# Patient Record
Sex: Female | Born: 1935
Health system: Southern US, Community
[De-identification: ages and names within clinical notes are randomized; demographics above are authoritative.]

## PROBLEM LIST (undated history)

## (undated) DIAGNOSIS — G4700 Insomnia, unspecified: Secondary | ICD-10-CM

## (undated) DIAGNOSIS — M810 Age-related osteoporosis without current pathological fracture: Secondary | ICD-10-CM

## (undated) DIAGNOSIS — E876 Hypokalemia: Secondary | ICD-10-CM

## (undated) DIAGNOSIS — K219 Gastro-esophageal reflux disease without esophagitis: Secondary | ICD-10-CM

## (undated) DIAGNOSIS — Z7409 Other reduced mobility: Secondary | ICD-10-CM

## (undated) DIAGNOSIS — I10 Essential (primary) hypertension: Secondary | ICD-10-CM

## (undated) DIAGNOSIS — F329 Major depressive disorder, single episode, unspecified: Secondary | ICD-10-CM

## (undated) DIAGNOSIS — F1120 Opioid dependence, uncomplicated: Secondary | ICD-10-CM

## (undated) DIAGNOSIS — E039 Hypothyroidism, unspecified: Secondary | ICD-10-CM

## (undated) DIAGNOSIS — N183 Chronic kidney disease, stage 3 unspecified: Secondary | ICD-10-CM

## (undated) DIAGNOSIS — F0390 Unspecified dementia without behavioral disturbance: Secondary | ICD-10-CM

---

## 2018-05-18 DIAGNOSIS — R627 Adult failure to thrive: Secondary | ICD-10-CM | POA: Diagnosis not present

## 2018-05-18 DIAGNOSIS — M25462 Effusion, left knee: Secondary | ICD-10-CM | POA: Diagnosis not present

## 2018-05-18 DIAGNOSIS — G894 Chronic pain syndrome: Secondary | ICD-10-CM | POA: Diagnosis not present

## 2018-05-18 DIAGNOSIS — I10 Essential (primary) hypertension: Secondary | ICD-10-CM | POA: Diagnosis not present

## 2018-05-24 ENCOUNTER — Ambulatory Visit (INDEPENDENT_AMBULATORY_CARE_PROVIDER_SITE_OTHER): Payer: Medicare HMO

## 2018-05-24 ENCOUNTER — Encounter (INDEPENDENT_AMBULATORY_CARE_PROVIDER_SITE_OTHER): Payer: Self-pay | Admitting: Orthopaedic Surgery

## 2018-05-24 ENCOUNTER — Ambulatory Visit (INDEPENDENT_AMBULATORY_CARE_PROVIDER_SITE_OTHER): Payer: Medicare HMO | Admitting: Orthopaedic Surgery

## 2018-05-24 VITALS — BP 167/81 | HR 70 | Ht 61.0 in | Wt 87.0 lb

## 2018-05-24 DIAGNOSIS — M1711 Unilateral primary osteoarthritis, right knee: Secondary | ICD-10-CM

## 2018-05-24 MED ORDER — LIDOCAINE HCL 1 % IJ SOLN
2.0000 mL | INTRAMUSCULAR | Status: AC | PRN
  Administered 2018-05-24: 2 mL

## 2018-05-24 MED ORDER — BUPIVACAINE HCL 0.5 % IJ SOLN
2.0000 mL | INTRAMUSCULAR | Status: AC | PRN
  Administered 2018-05-24: 2 mL via INTRA_ARTICULAR

## 2018-05-24 MED ORDER — METHYLPREDNISOLONE ACETATE 40 MG/ML IJ SUSP
80.0000 mg | INTRAMUSCULAR | Status: AC | PRN
  Administered 2018-05-24: 80 mg

## 2018-05-24 NOTE — Progress Notes (Signed)
Office Visit Note   Patient: Melissa Hill           Date of Birth: Mar 29, 1930           MRN: [id number] Visit Date: 05/24/2018              Requested by: No referring provider defined for this encounter. PCP: No primary care provider on file.   Assessment & Plan: Visit Diagnoses:  1. Unilateral primary osteoarthritis, right knee     Plan: Osteoarthritis right knee with chondrocalcinosis.  Will inject knee with cortisone and monitor response  Follow-Up Instructions: Return if symptoms worsen or fail to improve.   Orders:  Orders Placed This Encounter  Procedures  . Large Joint Inj: R knee  . XR KNEE 3 VIEW RIGHT   No orders of the defined types were placed in this encounter.     Procedures: Large Joint Inj: R knee on 05/24/2018 12:08 PM Indications: pain and diagnostic evaluation Details: 25 G 1.5 in needle, anteromedial approach  Arthrogram: No  Medications: 2 mL lidocaine 1 %; 2 mL bupivacaine 0.5 %; 80 mg methylPREDNISolone acetate 40 MG/ML Procedure, treatment alternatives, risks and benefits explained, specific risks discussed. Consent was given by the patient. Immediately prior to procedure a time out was called to verify the correct patient, procedure, equipment, support staff and site/side marked as required. Patient was prepped and draped in the usual sterile fashion.       Clinical Data: No additional findings.   Subjective: Chief Complaint  Patient presents with  . New Patient (Initial Visit)    R KNEE PAIN AND EFFUSION NO INJURY GETTING WORSE LAST 2 WEEKS  Mrs. Melissa Hill is 84 years old and accompanied by several family members and seen for evaluation of right knee pain.  She experienced insidious onset of knee pain over the past several weeks.  She denies any injury or trauma.  She is tried heat, lidocaine patches, methadone and naproxen.  She feels like her knee is larger than the left knee.  She has had some difficulty with ambulation.  [facility]  HPI  Review of Systems  Constitutional: Positive for fatigue. Negative for fever.  HENT: Negative for ear pain.   Eyes: Negative for pain.  Respiratory: Negative for cough and shortness of breath.   Cardiovascular: Positive for leg swelling.  Gastrointestinal: Positive for constipation. Negative for diarrhea.  Genitourinary: Negative for difficulty urinating.  Musculoskeletal: Positive for back pain. Negative for neck pain.  Skin: Negative for rash.  Allergic/Immunologic: Negative for food allergies.  Neurological: Positive for weakness. Negative for numbness.  Hematological: Bruises/bleeds easily.  Psychiatric/Behavioral: Negative for sleep disturbance.     Objective: Vital Signs: BP (!) 167/81 (BP Location: Left Arm, Patient Position: Sitting, Cuff Size: Normal)   Pulse 70   Ht 5\' 1"  (1.549 m)   Wt 87 lb (39.5 kg)   BMI 16.44 kg/m   Physical Exam  Constitutional: She is oriented to person, place, and time. She appears well-developed and well-nourished.  HENT:  Mouth/Throat: Oropharynx is clear and moist.  Eyes: Pupils are equal, round, and reactive to light. EOM are normal.  Pulmonary/Chest: Effort normal.  Neurological: She is alert and oriented to person, place, and time.  Skin: Skin is warm and dry.  Psychiatric: She has a normal mood and affect. Her behavior is normal.    Ortho Exam awake alert and oriented x3.  Comfortable sitting.  Slightly hard of hearing.  Very pleasant.  Right knee with small effusion.  Having pain along the medial lateral compartments.  Lacks just a few degrees to full extension and flexed over 100 degrees.  No instability.  No popliteal pain.  No calf pain.  +1 pulses.  No distal edema.  Normal sensibility.  Straight leg raise negative.  Painless range of motion both hips  Specialty Comments:  No specialty comments available.  Imaging: Xr Knee 3 View Right  Result Date: 05/24/2018 Films of the right knee repayment  several projections standing.  There is diffuse calcification within the menisci consistent with chondrocalcinosis.  Diffuse bony demineralization.  Diffuse calcification within the arterial system.  Some mild decrease in the joint spaces particularly medially with no acute changes.  Small effusion    PMFS History: There are no active problems to display for this patient.  History reviewed. No pertinent past medical history.  History reviewed. No pertinent family history.  Past Surgical History:  Procedure Laterality Date  . CESAREAN SECTION     Social History   Occupational History  . Not on file  Tobacco Use  . Smoking status: Former Games developer  . Smokeless tobacco: Never Used  Substance and Sexual Activity  . Alcohol use: Not Currently  . Drug use: Not Currently  . Sexual activity: Not on file

## 2018-06-12 DIAGNOSIS — K219 Gastro-esophageal reflux disease without esophagitis: Secondary | ICD-10-CM | POA: Diagnosis not present

## 2018-06-12 DIAGNOSIS — F1120 Opioid dependence, uncomplicated: Secondary | ICD-10-CM | POA: Diagnosis not present

## 2018-06-12 DIAGNOSIS — I10 Essential (primary) hypertension: Secondary | ICD-10-CM | POA: Diagnosis not present

## 2018-06-12 DIAGNOSIS — R627 Adult failure to thrive: Secondary | ICD-10-CM | POA: Diagnosis not present

## 2018-06-27 DIAGNOSIS — M2042 Other hammer toe(s) (acquired), left foot: Secondary | ICD-10-CM | POA: Diagnosis not present

## 2018-06-27 DIAGNOSIS — B351 Tinea unguium: Secondary | ICD-10-CM | POA: Diagnosis not present

## 2018-07-13 DIAGNOSIS — E039 Hypothyroidism, unspecified: Secondary | ICD-10-CM | POA: Diagnosis not present

## 2018-07-13 DIAGNOSIS — G894 Chronic pain syndrome: Secondary | ICD-10-CM | POA: Diagnosis not present

## 2018-07-13 DIAGNOSIS — R627 Adult failure to thrive: Secondary | ICD-10-CM | POA: Diagnosis not present

## 2018-07-13 DIAGNOSIS — I10 Essential (primary) hypertension: Secondary | ICD-10-CM | POA: Diagnosis not present

## 2018-07-28 DIAGNOSIS — E559 Vitamin D deficiency, unspecified: Secondary | ICD-10-CM | POA: Diagnosis not present

## 2018-07-28 DIAGNOSIS — D518 Other vitamin B12 deficiency anemias: Secondary | ICD-10-CM | POA: Diagnosis not present

## 2018-07-28 DIAGNOSIS — E038 Other specified hypothyroidism: Secondary | ICD-10-CM | POA: Diagnosis not present

## 2018-07-28 DIAGNOSIS — E119 Type 2 diabetes mellitus without complications: Secondary | ICD-10-CM | POA: Diagnosis not present

## 2018-07-28 DIAGNOSIS — Z79899 Other long term (current) drug therapy: Secondary | ICD-10-CM | POA: Diagnosis not present

## 2018-07-28 DIAGNOSIS — E7849 Other hyperlipidemia: Secondary | ICD-10-CM | POA: Diagnosis not present

## 2018-08-17 DIAGNOSIS — E119 Type 2 diabetes mellitus without complications: Secondary | ICD-10-CM | POA: Diagnosis not present

## 2018-08-17 DIAGNOSIS — E038 Other specified hypothyroidism: Secondary | ICD-10-CM | POA: Diagnosis not present

## 2018-08-24 DIAGNOSIS — R627 Adult failure to thrive: Secondary | ICD-10-CM | POA: Diagnosis not present

## 2018-08-24 DIAGNOSIS — I10 Essential (primary) hypertension: Secondary | ICD-10-CM | POA: Diagnosis not present

## 2018-08-24 DIAGNOSIS — K219 Gastro-esophageal reflux disease without esophagitis: Secondary | ICD-10-CM | POA: Diagnosis not present

## 2018-08-24 DIAGNOSIS — N183 Chronic kidney disease, stage 3 (moderate): Secondary | ICD-10-CM | POA: Diagnosis not present

## 2018-09-07 DIAGNOSIS — G894 Chronic pain syndrome: Secondary | ICD-10-CM | POA: Diagnosis not present

## 2018-09-07 DIAGNOSIS — J00 Acute nasopharyngitis [common cold]: Secondary | ICD-10-CM | POA: Diagnosis not present

## 2018-09-07 DIAGNOSIS — R197 Diarrhea, unspecified: Secondary | ICD-10-CM | POA: Diagnosis not present

## 2018-09-07 DIAGNOSIS — N3000 Acute cystitis without hematuria: Secondary | ICD-10-CM | POA: Diagnosis not present

## 2018-09-11 DIAGNOSIS — N390 Urinary tract infection, site not specified: Secondary | ICD-10-CM | POA: Diagnosis not present

## 2018-09-14 DIAGNOSIS — E038 Other specified hypothyroidism: Secondary | ICD-10-CM | POA: Diagnosis not present

## 2018-09-14 DIAGNOSIS — E119 Type 2 diabetes mellitus without complications: Secondary | ICD-10-CM | POA: Diagnosis not present

## 2018-09-21 DIAGNOSIS — F0281 Dementia in other diseases classified elsewhere with behavioral disturbance: Secondary | ICD-10-CM | POA: Diagnosis not present

## 2018-09-21 DIAGNOSIS — K219 Gastro-esophageal reflux disease without esophagitis: Secondary | ICD-10-CM | POA: Diagnosis not present

## 2018-09-21 DIAGNOSIS — I10 Essential (primary) hypertension: Secondary | ICD-10-CM | POA: Diagnosis not present

## 2018-09-21 DIAGNOSIS — G894 Chronic pain syndrome: Secondary | ICD-10-CM | POA: Diagnosis not present

## 2018-09-28 DIAGNOSIS — G300 Alzheimer's disease with early onset: Secondary | ICD-10-CM | POA: Diagnosis not present

## 2018-09-29 DIAGNOSIS — G300 Alzheimer's disease with early onset: Secondary | ICD-10-CM | POA: Diagnosis not present

## 2018-09-30 DIAGNOSIS — G300 Alzheimer's disease with early onset: Secondary | ICD-10-CM | POA: Diagnosis not present

## 2018-10-01 DIAGNOSIS — G300 Alzheimer's disease with early onset: Secondary | ICD-10-CM | POA: Diagnosis not present

## 2018-10-02 DIAGNOSIS — G300 Alzheimer's disease with early onset: Secondary | ICD-10-CM | POA: Diagnosis not present

## 2018-10-03 DIAGNOSIS — F0390 Unspecified dementia without behavioral disturbance: Secondary | ICD-10-CM | POA: Diagnosis not present

## 2018-10-03 DIAGNOSIS — F411 Generalized anxiety disorder: Secondary | ICD-10-CM | POA: Diagnosis not present

## 2018-10-03 DIAGNOSIS — Z87898 Personal history of other specified conditions: Secondary | ICD-10-CM | POA: Diagnosis not present

## 2018-10-03 DIAGNOSIS — G300 Alzheimer's disease with early onset: Secondary | ICD-10-CM | POA: Diagnosis not present

## 2018-10-03 DIAGNOSIS — F39 Unspecified mood [affective] disorder: Secondary | ICD-10-CM | POA: Diagnosis not present

## 2018-10-04 DIAGNOSIS — G300 Alzheimer's disease with early onset: Secondary | ICD-10-CM | POA: Diagnosis not present

## 2018-10-05 DIAGNOSIS — L209 Atopic dermatitis, unspecified: Secondary | ICD-10-CM | POA: Diagnosis not present

## 2018-10-05 DIAGNOSIS — L03115 Cellulitis of right lower limb: Secondary | ICD-10-CM | POA: Diagnosis not present

## 2018-10-05 DIAGNOSIS — G300 Alzheimer's disease with early onset: Secondary | ICD-10-CM | POA: Diagnosis not present

## 2018-10-06 DIAGNOSIS — G300 Alzheimer's disease with early onset: Secondary | ICD-10-CM | POA: Diagnosis not present

## 2018-10-07 DIAGNOSIS — G300 Alzheimer's disease with early onset: Secondary | ICD-10-CM | POA: Diagnosis not present

## 2018-10-08 DIAGNOSIS — G300 Alzheimer's disease with early onset: Secondary | ICD-10-CM | POA: Diagnosis not present

## 2018-10-09 DIAGNOSIS — G300 Alzheimer's disease with early onset: Secondary | ICD-10-CM | POA: Diagnosis not present

## 2018-10-10 DIAGNOSIS — G300 Alzheimer's disease with early onset: Secondary | ICD-10-CM | POA: Diagnosis not present

## 2018-10-11 DIAGNOSIS — G300 Alzheimer's disease with early onset: Secondary | ICD-10-CM | POA: Diagnosis not present

## 2018-10-12 DIAGNOSIS — G300 Alzheimer's disease with early onset: Secondary | ICD-10-CM | POA: Diagnosis not present

## 2018-10-13 DIAGNOSIS — G300 Alzheimer's disease with early onset: Secondary | ICD-10-CM | POA: Diagnosis not present

## 2018-10-14 DIAGNOSIS — G300 Alzheimer's disease with early onset: Secondary | ICD-10-CM | POA: Diagnosis not present

## 2018-10-15 DIAGNOSIS — G300 Alzheimer's disease with early onset: Secondary | ICD-10-CM | POA: Diagnosis not present

## 2018-10-16 DIAGNOSIS — G300 Alzheimer's disease with early onset: Secondary | ICD-10-CM | POA: Diagnosis not present

## 2018-10-17 DIAGNOSIS — G300 Alzheimer's disease with early onset: Secondary | ICD-10-CM | POA: Diagnosis not present

## 2018-10-18 DIAGNOSIS — G300 Alzheimer's disease with early onset: Secondary | ICD-10-CM | POA: Diagnosis not present

## 2018-10-19 DIAGNOSIS — G300 Alzheimer's disease with early onset: Secondary | ICD-10-CM | POA: Diagnosis not present

## 2018-10-20 DIAGNOSIS — G300 Alzheimer's disease with early onset: Secondary | ICD-10-CM | POA: Diagnosis not present

## 2018-10-21 DIAGNOSIS — G300 Alzheimer's disease with early onset: Secondary | ICD-10-CM | POA: Diagnosis not present

## 2018-10-22 DIAGNOSIS — G300 Alzheimer's disease with early onset: Secondary | ICD-10-CM | POA: Diagnosis not present

## 2018-10-23 DIAGNOSIS — G300 Alzheimer's disease with early onset: Secondary | ICD-10-CM | POA: Diagnosis not present

## 2018-10-24 DIAGNOSIS — F0390 Unspecified dementia without behavioral disturbance: Secondary | ICD-10-CM | POA: Diagnosis not present

## 2018-10-24 DIAGNOSIS — Z888 Allergy status to other drugs, medicaments and biological substances status: Secondary | ICD-10-CM | POA: Diagnosis not present

## 2018-10-24 DIAGNOSIS — R109 Unspecified abdominal pain: Secondary | ICD-10-CM | POA: Diagnosis not present

## 2018-10-24 DIAGNOSIS — Z79899 Other long term (current) drug therapy: Secondary | ICD-10-CM | POA: Diagnosis not present

## 2018-10-24 DIAGNOSIS — Z87891 Personal history of nicotine dependence: Secondary | ICD-10-CM | POA: Diagnosis not present

## 2018-10-24 DIAGNOSIS — Z79891 Long term (current) use of opiate analgesic: Secondary | ICD-10-CM | POA: Diagnosis not present

## 2018-10-25 DIAGNOSIS — G300 Alzheimer's disease with early onset: Secondary | ICD-10-CM | POA: Diagnosis not present

## 2018-10-26 DIAGNOSIS — G300 Alzheimer's disease with early onset: Secondary | ICD-10-CM | POA: Diagnosis not present

## 2018-10-27 DIAGNOSIS — G300 Alzheimer's disease with early onset: Secondary | ICD-10-CM | POA: Diagnosis not present

## 2018-10-28 DIAGNOSIS — G300 Alzheimer's disease with early onset: Secondary | ICD-10-CM | POA: Diagnosis not present

## 2018-10-29 DIAGNOSIS — G300 Alzheimer's disease with early onset: Secondary | ICD-10-CM | POA: Diagnosis not present

## 2018-10-30 DIAGNOSIS — G300 Alzheimer's disease with early onset: Secondary | ICD-10-CM | POA: Diagnosis not present

## 2018-10-31 DIAGNOSIS — G300 Alzheimer's disease with early onset: Secondary | ICD-10-CM | POA: Diagnosis not present

## 2018-11-01 DIAGNOSIS — G300 Alzheimer's disease with early onset: Secondary | ICD-10-CM | POA: Diagnosis not present

## 2018-11-02 DIAGNOSIS — M545 Low back pain: Secondary | ICD-10-CM | POA: Diagnosis not present

## 2018-11-02 DIAGNOSIS — R1310 Dysphagia, unspecified: Secondary | ICD-10-CM | POA: Diagnosis not present

## 2018-11-02 DIAGNOSIS — G300 Alzheimer's disease with early onset: Secondary | ICD-10-CM | POA: Diagnosis not present

## 2018-11-02 DIAGNOSIS — G894 Chronic pain syndrome: Secondary | ICD-10-CM | POA: Diagnosis not present

## 2018-11-02 DIAGNOSIS — F0281 Dementia in other diseases classified elsewhere with behavioral disturbance: Secondary | ICD-10-CM | POA: Diagnosis not present

## 2018-11-03 DIAGNOSIS — F39 Unspecified mood [affective] disorder: Secondary | ICD-10-CM | POA: Diagnosis not present

## 2018-11-03 DIAGNOSIS — G300 Alzheimer's disease with early onset: Secondary | ICD-10-CM | POA: Diagnosis not present

## 2018-11-03 DIAGNOSIS — F411 Generalized anxiety disorder: Secondary | ICD-10-CM | POA: Diagnosis not present

## 2018-11-03 DIAGNOSIS — F0390 Unspecified dementia without behavioral disturbance: Secondary | ICD-10-CM | POA: Diagnosis not present

## 2018-11-03 DIAGNOSIS — Z87898 Personal history of other specified conditions: Secondary | ICD-10-CM | POA: Diagnosis not present

## 2018-11-04 DIAGNOSIS — G300 Alzheimer's disease with early onset: Secondary | ICD-10-CM | POA: Diagnosis not present

## 2018-11-05 DIAGNOSIS — G300 Alzheimer's disease with early onset: Secondary | ICD-10-CM | POA: Diagnosis not present

## 2018-11-07 DIAGNOSIS — R32 Unspecified urinary incontinence: Secondary | ICD-10-CM | POA: Diagnosis not present

## 2018-11-09 DIAGNOSIS — E119 Type 2 diabetes mellitus without complications: Secondary | ICD-10-CM | POA: Diagnosis not present

## 2018-11-09 DIAGNOSIS — E038 Other specified hypothyroidism: Secondary | ICD-10-CM | POA: Diagnosis not present

## 2018-11-09 DIAGNOSIS — D518 Other vitamin B12 deficiency anemias: Secondary | ICD-10-CM | POA: Diagnosis not present

## 2018-11-13 DIAGNOSIS — G300 Alzheimer's disease with early onset: Secondary | ICD-10-CM | POA: Diagnosis not present

## 2018-11-14 DIAGNOSIS — R1310 Dysphagia, unspecified: Secondary | ICD-10-CM | POA: Diagnosis not present

## 2018-11-14 DIAGNOSIS — R1312 Dysphagia, oropharyngeal phase: Secondary | ICD-10-CM | POA: Diagnosis not present

## 2018-11-14 DIAGNOSIS — N183 Chronic kidney disease, stage 3 (moderate): Secondary | ICD-10-CM | POA: Diagnosis not present

## 2018-11-14 DIAGNOSIS — M810 Age-related osteoporosis without current pathological fracture: Secondary | ICD-10-CM | POA: Diagnosis not present

## 2018-11-14 DIAGNOSIS — G8929 Other chronic pain: Secondary | ICD-10-CM | POA: Diagnosis not present

## 2018-11-14 DIAGNOSIS — G300 Alzheimer's disease with early onset: Secondary | ICD-10-CM | POA: Diagnosis not present

## 2018-11-14 DIAGNOSIS — M1990 Unspecified osteoarthritis, unspecified site: Secondary | ICD-10-CM | POA: Diagnosis not present

## 2018-11-14 DIAGNOSIS — I129 Hypertensive chronic kidney disease with stage 1 through stage 4 chronic kidney disease, or unspecified chronic kidney disease: Secondary | ICD-10-CM | POA: Diagnosis not present

## 2018-11-14 DIAGNOSIS — F0390 Unspecified dementia without behavioral disturbance: Secondary | ICD-10-CM | POA: Diagnosis not present

## 2018-11-14 DIAGNOSIS — F339 Major depressive disorder, recurrent, unspecified: Secondary | ICD-10-CM | POA: Diagnosis not present

## 2018-11-15 DIAGNOSIS — G300 Alzheimer's disease with early onset: Secondary | ICD-10-CM | POA: Diagnosis not present

## 2018-11-16 DIAGNOSIS — G300 Alzheimer's disease with early onset: Secondary | ICD-10-CM | POA: Diagnosis not present

## 2018-11-17 DIAGNOSIS — G300 Alzheimer's disease with early onset: Secondary | ICD-10-CM | POA: Diagnosis not present

## 2018-11-18 DIAGNOSIS — G300 Alzheimer's disease with early onset: Secondary | ICD-10-CM | POA: Diagnosis not present

## 2018-11-19 DIAGNOSIS — G300 Alzheimer's disease with early onset: Secondary | ICD-10-CM | POA: Diagnosis not present

## 2018-11-20 DIAGNOSIS — G300 Alzheimer's disease with early onset: Secondary | ICD-10-CM | POA: Diagnosis not present

## 2018-11-20 DIAGNOSIS — I129 Hypertensive chronic kidney disease with stage 1 through stage 4 chronic kidney disease, or unspecified chronic kidney disease: Secondary | ICD-10-CM | POA: Diagnosis not present

## 2018-11-20 DIAGNOSIS — R1312 Dysphagia, oropharyngeal phase: Secondary | ICD-10-CM | POA: Diagnosis not present

## 2018-11-20 DIAGNOSIS — F0390 Unspecified dementia without behavioral disturbance: Secondary | ICD-10-CM | POA: Diagnosis not present

## 2018-11-20 DIAGNOSIS — R1310 Dysphagia, unspecified: Secondary | ICD-10-CM | POA: Diagnosis not present

## 2018-11-20 DIAGNOSIS — N183 Chronic kidney disease, stage 3 (moderate): Secondary | ICD-10-CM | POA: Diagnosis not present

## 2018-11-20 DIAGNOSIS — M810 Age-related osteoporosis without current pathological fracture: Secondary | ICD-10-CM | POA: Diagnosis not present

## 2018-11-20 DIAGNOSIS — F339 Major depressive disorder, recurrent, unspecified: Secondary | ICD-10-CM | POA: Diagnosis not present

## 2018-11-20 DIAGNOSIS — G8929 Other chronic pain: Secondary | ICD-10-CM | POA: Diagnosis not present

## 2018-11-20 DIAGNOSIS — M1990 Unspecified osteoarthritis, unspecified site: Secondary | ICD-10-CM | POA: Diagnosis not present

## 2018-11-21 DIAGNOSIS — G300 Alzheimer's disease with early onset: Secondary | ICD-10-CM | POA: Diagnosis not present

## 2018-11-22 DIAGNOSIS — M1990 Unspecified osteoarthritis, unspecified site: Secondary | ICD-10-CM | POA: Diagnosis not present

## 2018-11-22 DIAGNOSIS — R1310 Dysphagia, unspecified: Secondary | ICD-10-CM | POA: Diagnosis not present

## 2018-11-22 DIAGNOSIS — N183 Chronic kidney disease, stage 3 (moderate): Secondary | ICD-10-CM | POA: Diagnosis not present

## 2018-11-22 DIAGNOSIS — M810 Age-related osteoporosis without current pathological fracture: Secondary | ICD-10-CM | POA: Diagnosis not present

## 2018-11-22 DIAGNOSIS — F339 Major depressive disorder, recurrent, unspecified: Secondary | ICD-10-CM | POA: Diagnosis not present

## 2018-11-22 DIAGNOSIS — G8929 Other chronic pain: Secondary | ICD-10-CM | POA: Diagnosis not present

## 2018-11-22 DIAGNOSIS — F0390 Unspecified dementia without behavioral disturbance: Secondary | ICD-10-CM | POA: Diagnosis not present

## 2018-11-22 DIAGNOSIS — I129 Hypertensive chronic kidney disease with stage 1 through stage 4 chronic kidney disease, or unspecified chronic kidney disease: Secondary | ICD-10-CM | POA: Diagnosis not present

## 2018-11-22 DIAGNOSIS — G300 Alzheimer's disease with early onset: Secondary | ICD-10-CM | POA: Diagnosis not present

## 2018-11-22 DIAGNOSIS — R1312 Dysphagia, oropharyngeal phase: Secondary | ICD-10-CM | POA: Diagnosis not present

## 2018-11-23 DIAGNOSIS — G300 Alzheimer's disease with early onset: Secondary | ICD-10-CM | POA: Diagnosis not present

## 2018-11-24 DIAGNOSIS — G300 Alzheimer's disease with early onset: Secondary | ICD-10-CM | POA: Diagnosis not present

## 2018-11-25 DIAGNOSIS — G300 Alzheimer's disease with early onset: Secondary | ICD-10-CM | POA: Diagnosis not present

## 2018-11-26 DIAGNOSIS — G300 Alzheimer's disease with early onset: Secondary | ICD-10-CM | POA: Diagnosis not present

## 2018-11-26 DIAGNOSIS — M79671 Pain in right foot: Secondary | ICD-10-CM | POA: Diagnosis not present

## 2018-11-27 DIAGNOSIS — G8929 Other chronic pain: Secondary | ICD-10-CM | POA: Diagnosis not present

## 2018-11-27 DIAGNOSIS — R1310 Dysphagia, unspecified: Secondary | ICD-10-CM | POA: Diagnosis not present

## 2018-11-27 DIAGNOSIS — M810 Age-related osteoporosis without current pathological fracture: Secondary | ICD-10-CM | POA: Diagnosis not present

## 2018-11-27 DIAGNOSIS — G300 Alzheimer's disease with early onset: Secondary | ICD-10-CM | POA: Diagnosis not present

## 2018-11-27 DIAGNOSIS — F339 Major depressive disorder, recurrent, unspecified: Secondary | ICD-10-CM | POA: Diagnosis not present

## 2018-11-27 DIAGNOSIS — I129 Hypertensive chronic kidney disease with stage 1 through stage 4 chronic kidney disease, or unspecified chronic kidney disease: Secondary | ICD-10-CM | POA: Diagnosis not present

## 2018-11-27 DIAGNOSIS — F0390 Unspecified dementia without behavioral disturbance: Secondary | ICD-10-CM | POA: Diagnosis not present

## 2018-11-27 DIAGNOSIS — R1312 Dysphagia, oropharyngeal phase: Secondary | ICD-10-CM | POA: Diagnosis not present

## 2018-11-27 DIAGNOSIS — M1990 Unspecified osteoarthritis, unspecified site: Secondary | ICD-10-CM | POA: Diagnosis not present

## 2018-11-27 DIAGNOSIS — N183 Chronic kidney disease, stage 3 (moderate): Secondary | ICD-10-CM | POA: Diagnosis not present

## 2018-11-28 DIAGNOSIS — G300 Alzheimer's disease with early onset: Secondary | ICD-10-CM | POA: Diagnosis not present

## 2018-11-29 DIAGNOSIS — R1312 Dysphagia, oropharyngeal phase: Secondary | ICD-10-CM | POA: Diagnosis not present

## 2018-11-29 DIAGNOSIS — N183 Chronic kidney disease, stage 3 (moderate): Secondary | ICD-10-CM | POA: Diagnosis not present

## 2018-11-29 DIAGNOSIS — M810 Age-related osteoporosis without current pathological fracture: Secondary | ICD-10-CM | POA: Diagnosis not present

## 2018-11-29 DIAGNOSIS — R1310 Dysphagia, unspecified: Secondary | ICD-10-CM | POA: Diagnosis not present

## 2018-11-29 DIAGNOSIS — F0390 Unspecified dementia without behavioral disturbance: Secondary | ICD-10-CM | POA: Diagnosis not present

## 2018-11-29 DIAGNOSIS — G8929 Other chronic pain: Secondary | ICD-10-CM | POA: Diagnosis not present

## 2018-11-29 DIAGNOSIS — I129 Hypertensive chronic kidney disease with stage 1 through stage 4 chronic kidney disease, or unspecified chronic kidney disease: Secondary | ICD-10-CM | POA: Diagnosis not present

## 2018-11-29 DIAGNOSIS — G300 Alzheimer's disease with early onset: Secondary | ICD-10-CM | POA: Diagnosis not present

## 2018-11-29 DIAGNOSIS — M1990 Unspecified osteoarthritis, unspecified site: Secondary | ICD-10-CM | POA: Diagnosis not present

## 2018-11-29 DIAGNOSIS — F339 Major depressive disorder, recurrent, unspecified: Secondary | ICD-10-CM | POA: Diagnosis not present

## 2018-11-30 DIAGNOSIS — R627 Adult failure to thrive: Secondary | ICD-10-CM | POA: Diagnosis not present

## 2018-11-30 DIAGNOSIS — F39 Unspecified mood [affective] disorder: Secondary | ICD-10-CM | POA: Diagnosis not present

## 2018-11-30 DIAGNOSIS — L209 Atopic dermatitis, unspecified: Secondary | ICD-10-CM | POA: Diagnosis not present

## 2018-11-30 DIAGNOSIS — R32 Unspecified urinary incontinence: Secondary | ICD-10-CM | POA: Diagnosis not present

## 2018-11-30 DIAGNOSIS — G300 Alzheimer's disease with early onset: Secondary | ICD-10-CM | POA: Diagnosis not present

## 2018-11-30 DIAGNOSIS — I10 Essential (primary) hypertension: Secondary | ICD-10-CM | POA: Diagnosis not present

## 2018-11-30 DIAGNOSIS — F411 Generalized anxiety disorder: Secondary | ICD-10-CM | POA: Diagnosis not present

## 2018-11-30 DIAGNOSIS — F0390 Unspecified dementia without behavioral disturbance: Secondary | ICD-10-CM | POA: Diagnosis not present

## 2018-11-30 DIAGNOSIS — S9031XD Contusion of right foot, subsequent encounter: Secondary | ICD-10-CM | POA: Diagnosis not present

## 2018-11-30 DIAGNOSIS — Z87898 Personal history of other specified conditions: Secondary | ICD-10-CM | POA: Diagnosis not present

## 2018-12-01 DIAGNOSIS — M1990 Unspecified osteoarthritis, unspecified site: Secondary | ICD-10-CM | POA: Diagnosis not present

## 2018-12-01 DIAGNOSIS — N183 Chronic kidney disease, stage 3 (moderate): Secondary | ICD-10-CM | POA: Diagnosis not present

## 2018-12-01 DIAGNOSIS — R1310 Dysphagia, unspecified: Secondary | ICD-10-CM | POA: Diagnosis not present

## 2018-12-01 DIAGNOSIS — I129 Hypertensive chronic kidney disease with stage 1 through stage 4 chronic kidney disease, or unspecified chronic kidney disease: Secondary | ICD-10-CM | POA: Diagnosis not present

## 2018-12-01 DIAGNOSIS — G300 Alzheimer's disease with early onset: Secondary | ICD-10-CM | POA: Diagnosis not present

## 2018-12-01 DIAGNOSIS — F339 Major depressive disorder, recurrent, unspecified: Secondary | ICD-10-CM | POA: Diagnosis not present

## 2018-12-01 DIAGNOSIS — R1312 Dysphagia, oropharyngeal phase: Secondary | ICD-10-CM | POA: Diagnosis not present

## 2018-12-01 DIAGNOSIS — M810 Age-related osteoporosis without current pathological fracture: Secondary | ICD-10-CM | POA: Diagnosis not present

## 2018-12-01 DIAGNOSIS — F0390 Unspecified dementia without behavioral disturbance: Secondary | ICD-10-CM | POA: Diagnosis not present

## 2018-12-01 DIAGNOSIS — G8929 Other chronic pain: Secondary | ICD-10-CM | POA: Diagnosis not present

## 2018-12-02 DIAGNOSIS — G300 Alzheimer's disease with early onset: Secondary | ICD-10-CM | POA: Diagnosis not present

## 2018-12-03 DIAGNOSIS — G300 Alzheimer's disease with early onset: Secondary | ICD-10-CM | POA: Diagnosis not present

## 2018-12-04 DIAGNOSIS — G300 Alzheimer's disease with early onset: Secondary | ICD-10-CM | POA: Diagnosis not present

## 2018-12-05 DIAGNOSIS — I129 Hypertensive chronic kidney disease with stage 1 through stage 4 chronic kidney disease, or unspecified chronic kidney disease: Secondary | ICD-10-CM | POA: Diagnosis not present

## 2018-12-05 DIAGNOSIS — R1310 Dysphagia, unspecified: Secondary | ICD-10-CM | POA: Diagnosis not present

## 2018-12-05 DIAGNOSIS — F0390 Unspecified dementia without behavioral disturbance: Secondary | ICD-10-CM | POA: Diagnosis not present

## 2018-12-05 DIAGNOSIS — G300 Alzheimer's disease with early onset: Secondary | ICD-10-CM | POA: Diagnosis not present

## 2018-12-05 DIAGNOSIS — N183 Chronic kidney disease, stage 3 (moderate): Secondary | ICD-10-CM | POA: Diagnosis not present

## 2018-12-05 DIAGNOSIS — R1312 Dysphagia, oropharyngeal phase: Secondary | ICD-10-CM | POA: Diagnosis not present

## 2018-12-05 DIAGNOSIS — M1990 Unspecified osteoarthritis, unspecified site: Secondary | ICD-10-CM | POA: Diagnosis not present

## 2018-12-05 DIAGNOSIS — F339 Major depressive disorder, recurrent, unspecified: Secondary | ICD-10-CM | POA: Diagnosis not present

## 2018-12-05 DIAGNOSIS — G8929 Other chronic pain: Secondary | ICD-10-CM | POA: Diagnosis not present

## 2018-12-05 DIAGNOSIS — M810 Age-related osteoporosis without current pathological fracture: Secondary | ICD-10-CM | POA: Diagnosis not present

## 2018-12-06 DIAGNOSIS — G300 Alzheimer's disease with early onset: Secondary | ICD-10-CM | POA: Diagnosis not present

## 2018-12-07 DIAGNOSIS — F0390 Unspecified dementia without behavioral disturbance: Secondary | ICD-10-CM | POA: Diagnosis not present

## 2018-12-07 DIAGNOSIS — M810 Age-related osteoporosis without current pathological fracture: Secondary | ICD-10-CM | POA: Diagnosis not present

## 2018-12-07 DIAGNOSIS — N183 Chronic kidney disease, stage 3 (moderate): Secondary | ICD-10-CM | POA: Diagnosis not present

## 2018-12-07 DIAGNOSIS — F339 Major depressive disorder, recurrent, unspecified: Secondary | ICD-10-CM | POA: Diagnosis not present

## 2018-12-07 DIAGNOSIS — R1310 Dysphagia, unspecified: Secondary | ICD-10-CM | POA: Diagnosis not present

## 2018-12-07 DIAGNOSIS — R1312 Dysphagia, oropharyngeal phase: Secondary | ICD-10-CM | POA: Diagnosis not present

## 2018-12-07 DIAGNOSIS — G300 Alzheimer's disease with early onset: Secondary | ICD-10-CM | POA: Diagnosis not present

## 2018-12-07 DIAGNOSIS — G8929 Other chronic pain: Secondary | ICD-10-CM | POA: Diagnosis not present

## 2018-12-07 DIAGNOSIS — I129 Hypertensive chronic kidney disease with stage 1 through stage 4 chronic kidney disease, or unspecified chronic kidney disease: Secondary | ICD-10-CM | POA: Diagnosis not present

## 2018-12-07 DIAGNOSIS — M1990 Unspecified osteoarthritis, unspecified site: Secondary | ICD-10-CM | POA: Diagnosis not present

## 2018-12-08 DIAGNOSIS — G300 Alzheimer's disease with early onset: Secondary | ICD-10-CM | POA: Diagnosis not present

## 2018-12-09 DIAGNOSIS — G300 Alzheimer's disease with early onset: Secondary | ICD-10-CM | POA: Diagnosis not present

## 2018-12-10 DIAGNOSIS — G300 Alzheimer's disease with early onset: Secondary | ICD-10-CM | POA: Diagnosis not present

## 2018-12-11 DIAGNOSIS — R1312 Dysphagia, oropharyngeal phase: Secondary | ICD-10-CM | POA: Diagnosis not present

## 2018-12-11 DIAGNOSIS — G8929 Other chronic pain: Secondary | ICD-10-CM | POA: Diagnosis not present

## 2018-12-11 DIAGNOSIS — M1990 Unspecified osteoarthritis, unspecified site: Secondary | ICD-10-CM | POA: Diagnosis not present

## 2018-12-11 DIAGNOSIS — M810 Age-related osteoporosis without current pathological fracture: Secondary | ICD-10-CM | POA: Diagnosis not present

## 2018-12-11 DIAGNOSIS — F0390 Unspecified dementia without behavioral disturbance: Secondary | ICD-10-CM | POA: Diagnosis not present

## 2018-12-11 DIAGNOSIS — G300 Alzheimer's disease with early onset: Secondary | ICD-10-CM | POA: Diagnosis not present

## 2018-12-11 DIAGNOSIS — F339 Major depressive disorder, recurrent, unspecified: Secondary | ICD-10-CM | POA: Diagnosis not present

## 2018-12-11 DIAGNOSIS — R1310 Dysphagia, unspecified: Secondary | ICD-10-CM | POA: Diagnosis not present

## 2018-12-11 DIAGNOSIS — N183 Chronic kidney disease, stage 3 (moderate): Secondary | ICD-10-CM | POA: Diagnosis not present

## 2018-12-11 DIAGNOSIS — I129 Hypertensive chronic kidney disease with stage 1 through stage 4 chronic kidney disease, or unspecified chronic kidney disease: Secondary | ICD-10-CM | POA: Diagnosis not present

## 2018-12-12 DIAGNOSIS — M1990 Unspecified osteoarthritis, unspecified site: Secondary | ICD-10-CM | POA: Diagnosis not present

## 2018-12-12 DIAGNOSIS — R1312 Dysphagia, oropharyngeal phase: Secondary | ICD-10-CM | POA: Diagnosis not present

## 2018-12-12 DIAGNOSIS — F339 Major depressive disorder, recurrent, unspecified: Secondary | ICD-10-CM | POA: Diagnosis not present

## 2018-12-12 DIAGNOSIS — F0390 Unspecified dementia without behavioral disturbance: Secondary | ICD-10-CM | POA: Diagnosis not present

## 2018-12-12 DIAGNOSIS — R1310 Dysphagia, unspecified: Secondary | ICD-10-CM | POA: Diagnosis not present

## 2018-12-12 DIAGNOSIS — G8929 Other chronic pain: Secondary | ICD-10-CM | POA: Diagnosis not present

## 2018-12-12 DIAGNOSIS — M810 Age-related osteoporosis without current pathological fracture: Secondary | ICD-10-CM | POA: Diagnosis not present

## 2018-12-12 DIAGNOSIS — G300 Alzheimer's disease with early onset: Secondary | ICD-10-CM | POA: Diagnosis not present

## 2018-12-12 DIAGNOSIS — I129 Hypertensive chronic kidney disease with stage 1 through stage 4 chronic kidney disease, or unspecified chronic kidney disease: Secondary | ICD-10-CM | POA: Diagnosis not present

## 2018-12-12 DIAGNOSIS — N183 Chronic kidney disease, stage 3 (moderate): Secondary | ICD-10-CM | POA: Diagnosis not present

## 2018-12-13 DIAGNOSIS — I129 Hypertensive chronic kidney disease with stage 1 through stage 4 chronic kidney disease, or unspecified chronic kidney disease: Secondary | ICD-10-CM | POA: Diagnosis not present

## 2018-12-13 DIAGNOSIS — F339 Major depressive disorder, recurrent, unspecified: Secondary | ICD-10-CM | POA: Diagnosis not present

## 2018-12-13 DIAGNOSIS — G300 Alzheimer's disease with early onset: Secondary | ICD-10-CM | POA: Diagnosis not present

## 2018-12-13 DIAGNOSIS — F0390 Unspecified dementia without behavioral disturbance: Secondary | ICD-10-CM | POA: Diagnosis not present

## 2018-12-13 DIAGNOSIS — M810 Age-related osteoporosis without current pathological fracture: Secondary | ICD-10-CM | POA: Diagnosis not present

## 2018-12-13 DIAGNOSIS — R1312 Dysphagia, oropharyngeal phase: Secondary | ICD-10-CM | POA: Diagnosis not present

## 2018-12-13 DIAGNOSIS — G8929 Other chronic pain: Secondary | ICD-10-CM | POA: Diagnosis not present

## 2018-12-13 DIAGNOSIS — N183 Chronic kidney disease, stage 3 (moderate): Secondary | ICD-10-CM | POA: Diagnosis not present

## 2018-12-13 DIAGNOSIS — M1990 Unspecified osteoarthritis, unspecified site: Secondary | ICD-10-CM | POA: Diagnosis not present

## 2018-12-13 DIAGNOSIS — R1310 Dysphagia, unspecified: Secondary | ICD-10-CM | POA: Diagnosis not present

## 2018-12-14 DIAGNOSIS — F330 Major depressive disorder, recurrent, mild: Secondary | ICD-10-CM | POA: Diagnosis not present

## 2018-12-14 DIAGNOSIS — G300 Alzheimer's disease with early onset: Secondary | ICD-10-CM | POA: Diagnosis not present

## 2018-12-14 DIAGNOSIS — F411 Generalized anxiety disorder: Secondary | ICD-10-CM | POA: Diagnosis not present

## 2018-12-15 DIAGNOSIS — G300 Alzheimer's disease with early onset: Secondary | ICD-10-CM | POA: Diagnosis not present

## 2018-12-16 DIAGNOSIS — D518 Other vitamin B12 deficiency anemias: Secondary | ICD-10-CM | POA: Diagnosis not present

## 2018-12-16 DIAGNOSIS — G300 Alzheimer's disease with early onset: Secondary | ICD-10-CM | POA: Diagnosis not present

## 2018-12-16 DIAGNOSIS — E038 Other specified hypothyroidism: Secondary | ICD-10-CM | POA: Diagnosis not present

## 2018-12-16 DIAGNOSIS — E119 Type 2 diabetes mellitus without complications: Secondary | ICD-10-CM | POA: Diagnosis not present

## 2018-12-17 DIAGNOSIS — G300 Alzheimer's disease with early onset: Secondary | ICD-10-CM | POA: Diagnosis not present

## 2018-12-18 DIAGNOSIS — M810 Age-related osteoporosis without current pathological fracture: Secondary | ICD-10-CM | POA: Diagnosis not present

## 2018-12-18 DIAGNOSIS — R1312 Dysphagia, oropharyngeal phase: Secondary | ICD-10-CM | POA: Diagnosis not present

## 2018-12-18 DIAGNOSIS — G300 Alzheimer's disease with early onset: Secondary | ICD-10-CM | POA: Diagnosis not present

## 2018-12-18 DIAGNOSIS — N183 Chronic kidney disease, stage 3 (moderate): Secondary | ICD-10-CM | POA: Diagnosis not present

## 2018-12-18 DIAGNOSIS — G8929 Other chronic pain: Secondary | ICD-10-CM | POA: Diagnosis not present

## 2018-12-18 DIAGNOSIS — F0390 Unspecified dementia without behavioral disturbance: Secondary | ICD-10-CM | POA: Diagnosis not present

## 2018-12-18 DIAGNOSIS — M1990 Unspecified osteoarthritis, unspecified site: Secondary | ICD-10-CM | POA: Diagnosis not present

## 2018-12-18 DIAGNOSIS — F339 Major depressive disorder, recurrent, unspecified: Secondary | ICD-10-CM | POA: Diagnosis not present

## 2018-12-18 DIAGNOSIS — I129 Hypertensive chronic kidney disease with stage 1 through stage 4 chronic kidney disease, or unspecified chronic kidney disease: Secondary | ICD-10-CM | POA: Diagnosis not present

## 2018-12-18 DIAGNOSIS — R1310 Dysphagia, unspecified: Secondary | ICD-10-CM | POA: Diagnosis not present

## 2018-12-19 DIAGNOSIS — G300 Alzheimer's disease with early onset: Secondary | ICD-10-CM | POA: Diagnosis not present

## 2018-12-20 DIAGNOSIS — G300 Alzheimer's disease with early onset: Secondary | ICD-10-CM | POA: Diagnosis not present

## 2018-12-21 DIAGNOSIS — G8929 Other chronic pain: Secondary | ICD-10-CM | POA: Diagnosis not present

## 2018-12-21 DIAGNOSIS — G300 Alzheimer's disease with early onset: Secondary | ICD-10-CM | POA: Diagnosis not present

## 2018-12-21 DIAGNOSIS — R1312 Dysphagia, oropharyngeal phase: Secondary | ICD-10-CM | POA: Diagnosis not present

## 2018-12-21 DIAGNOSIS — I129 Hypertensive chronic kidney disease with stage 1 through stage 4 chronic kidney disease, or unspecified chronic kidney disease: Secondary | ICD-10-CM | POA: Diagnosis not present

## 2018-12-21 DIAGNOSIS — F0390 Unspecified dementia without behavioral disturbance: Secondary | ICD-10-CM | POA: Diagnosis not present

## 2018-12-21 DIAGNOSIS — N183 Chronic kidney disease, stage 3 (moderate): Secondary | ICD-10-CM | POA: Diagnosis not present

## 2018-12-21 DIAGNOSIS — M1990 Unspecified osteoarthritis, unspecified site: Secondary | ICD-10-CM | POA: Diagnosis not present

## 2018-12-21 DIAGNOSIS — R1310 Dysphagia, unspecified: Secondary | ICD-10-CM | POA: Diagnosis not present

## 2018-12-21 DIAGNOSIS — M810 Age-related osteoporosis without current pathological fracture: Secondary | ICD-10-CM | POA: Diagnosis not present

## 2018-12-21 DIAGNOSIS — F339 Major depressive disorder, recurrent, unspecified: Secondary | ICD-10-CM | POA: Diagnosis not present

## 2018-12-22 DIAGNOSIS — G300 Alzheimer's disease with early onset: Secondary | ICD-10-CM | POA: Diagnosis not present

## 2018-12-23 DIAGNOSIS — G300 Alzheimer's disease with early onset: Secondary | ICD-10-CM | POA: Diagnosis not present

## 2018-12-24 DIAGNOSIS — G300 Alzheimer's disease with early onset: Secondary | ICD-10-CM | POA: Diagnosis not present

## 2018-12-25 DIAGNOSIS — G300 Alzheimer's disease with early onset: Secondary | ICD-10-CM | POA: Diagnosis not present

## 2018-12-26 DIAGNOSIS — G300 Alzheimer's disease with early onset: Secondary | ICD-10-CM | POA: Diagnosis not present

## 2018-12-27 DIAGNOSIS — G300 Alzheimer's disease with early onset: Secondary | ICD-10-CM | POA: Diagnosis not present

## 2018-12-28 DIAGNOSIS — N183 Chronic kidney disease, stage 3 (moderate): Secondary | ICD-10-CM | POA: Diagnosis not present

## 2018-12-28 DIAGNOSIS — M810 Age-related osteoporosis without current pathological fracture: Secondary | ICD-10-CM | POA: Diagnosis not present

## 2018-12-28 DIAGNOSIS — G8929 Other chronic pain: Secondary | ICD-10-CM | POA: Diagnosis not present

## 2018-12-28 DIAGNOSIS — M1990 Unspecified osteoarthritis, unspecified site: Secondary | ICD-10-CM | POA: Diagnosis not present

## 2018-12-28 DIAGNOSIS — F339 Major depressive disorder, recurrent, unspecified: Secondary | ICD-10-CM | POA: Diagnosis not present

## 2018-12-28 DIAGNOSIS — F0390 Unspecified dementia without behavioral disturbance: Secondary | ICD-10-CM | POA: Diagnosis not present

## 2018-12-28 DIAGNOSIS — R1312 Dysphagia, oropharyngeal phase: Secondary | ICD-10-CM | POA: Diagnosis not present

## 2018-12-28 DIAGNOSIS — G300 Alzheimer's disease with early onset: Secondary | ICD-10-CM | POA: Diagnosis not present

## 2018-12-28 DIAGNOSIS — R1310 Dysphagia, unspecified: Secondary | ICD-10-CM | POA: Diagnosis not present

## 2018-12-28 DIAGNOSIS — I129 Hypertensive chronic kidney disease with stage 1 through stage 4 chronic kidney disease, or unspecified chronic kidney disease: Secondary | ICD-10-CM | POA: Diagnosis not present

## 2018-12-29 DIAGNOSIS — R1310 Dysphagia, unspecified: Secondary | ICD-10-CM | POA: Diagnosis not present

## 2018-12-29 DIAGNOSIS — R1312 Dysphagia, oropharyngeal phase: Secondary | ICD-10-CM | POA: Diagnosis not present

## 2018-12-29 DIAGNOSIS — F339 Major depressive disorder, recurrent, unspecified: Secondary | ICD-10-CM | POA: Diagnosis not present

## 2018-12-29 DIAGNOSIS — M810 Age-related osteoporosis without current pathological fracture: Secondary | ICD-10-CM | POA: Diagnosis not present

## 2018-12-29 DIAGNOSIS — M1990 Unspecified osteoarthritis, unspecified site: Secondary | ICD-10-CM | POA: Diagnosis not present

## 2018-12-29 DIAGNOSIS — F0390 Unspecified dementia without behavioral disturbance: Secondary | ICD-10-CM | POA: Diagnosis not present

## 2018-12-29 DIAGNOSIS — N183 Chronic kidney disease, stage 3 (moderate): Secondary | ICD-10-CM | POA: Diagnosis not present

## 2018-12-29 DIAGNOSIS — G300 Alzheimer's disease with early onset: Secondary | ICD-10-CM | POA: Diagnosis not present

## 2018-12-29 DIAGNOSIS — I129 Hypertensive chronic kidney disease with stage 1 through stage 4 chronic kidney disease, or unspecified chronic kidney disease: Secondary | ICD-10-CM | POA: Diagnosis not present

## 2018-12-29 DIAGNOSIS — G8929 Other chronic pain: Secondary | ICD-10-CM | POA: Diagnosis not present

## 2018-12-30 DIAGNOSIS — G300 Alzheimer's disease with early onset: Secondary | ICD-10-CM | POA: Diagnosis not present

## 2018-12-31 DIAGNOSIS — G300 Alzheimer's disease with early onset: Secondary | ICD-10-CM | POA: Diagnosis not present

## 2019-01-01 DIAGNOSIS — R32 Unspecified urinary incontinence: Secondary | ICD-10-CM | POA: Diagnosis not present

## 2019-01-01 DIAGNOSIS — G300 Alzheimer's disease with early onset: Secondary | ICD-10-CM | POA: Diagnosis not present

## 2019-01-02 DIAGNOSIS — G300 Alzheimer's disease with early onset: Secondary | ICD-10-CM | POA: Diagnosis not present

## 2019-01-03 DIAGNOSIS — G300 Alzheimer's disease with early onset: Secondary | ICD-10-CM | POA: Diagnosis not present

## 2019-01-03 DIAGNOSIS — N390 Urinary tract infection, site not specified: Secondary | ICD-10-CM | POA: Diagnosis not present

## 2019-01-04 DIAGNOSIS — G300 Alzheimer's disease with early onset: Secondary | ICD-10-CM | POA: Diagnosis not present

## 2019-01-05 DIAGNOSIS — G300 Alzheimer's disease with early onset: Secondary | ICD-10-CM | POA: Diagnosis not present

## 2019-01-06 DIAGNOSIS — G300 Alzheimer's disease with early onset: Secondary | ICD-10-CM | POA: Diagnosis not present

## 2019-01-07 DIAGNOSIS — G300 Alzheimer's disease with early onset: Secondary | ICD-10-CM | POA: Diagnosis not present

## 2019-01-11 DIAGNOSIS — E039 Hypothyroidism, unspecified: Secondary | ICD-10-CM | POA: Diagnosis not present

## 2019-01-11 DIAGNOSIS — R627 Adult failure to thrive: Secondary | ICD-10-CM | POA: Diagnosis not present

## 2019-01-11 DIAGNOSIS — G894 Chronic pain syndrome: Secondary | ICD-10-CM | POA: Diagnosis not present

## 2019-01-11 DIAGNOSIS — N183 Chronic kidney disease, stage 3 (moderate): Secondary | ICD-10-CM | POA: Diagnosis not present

## 2019-01-15 DIAGNOSIS — Z87898 Personal history of other specified conditions: Secondary | ICD-10-CM | POA: Diagnosis not present

## 2019-01-15 DIAGNOSIS — F39 Unspecified mood [affective] disorder: Secondary | ICD-10-CM | POA: Diagnosis not present

## 2019-01-15 DIAGNOSIS — F0390 Unspecified dementia without behavioral disturbance: Secondary | ICD-10-CM | POA: Diagnosis not present

## 2019-01-15 DIAGNOSIS — F411 Generalized anxiety disorder: Secondary | ICD-10-CM | POA: Diagnosis not present

## 2019-01-16 DIAGNOSIS — D518 Other vitamin B12 deficiency anemias: Secondary | ICD-10-CM | POA: Diagnosis not present

## 2019-01-16 DIAGNOSIS — E038 Other specified hypothyroidism: Secondary | ICD-10-CM | POA: Diagnosis not present

## 2019-01-16 DIAGNOSIS — F411 Generalized anxiety disorder: Secondary | ICD-10-CM | POA: Diagnosis not present

## 2019-01-16 DIAGNOSIS — F330 Major depressive disorder, recurrent, mild: Secondary | ICD-10-CM | POA: Diagnosis not present

## 2019-01-16 DIAGNOSIS — E119 Type 2 diabetes mellitus without complications: Secondary | ICD-10-CM | POA: Diagnosis not present

## 2019-01-30 DIAGNOSIS — Z87898 Personal history of other specified conditions: Secondary | ICD-10-CM | POA: Diagnosis not present

## 2019-01-30 DIAGNOSIS — F0390 Unspecified dementia without behavioral disturbance: Secondary | ICD-10-CM | POA: Diagnosis not present

## 2019-01-30 DIAGNOSIS — F411 Generalized anxiety disorder: Secondary | ICD-10-CM | POA: Diagnosis not present

## 2019-01-30 DIAGNOSIS — F39 Unspecified mood [affective] disorder: Secondary | ICD-10-CM | POA: Diagnosis not present

## 2019-02-01 DIAGNOSIS — E038 Other specified hypothyroidism: Secondary | ICD-10-CM | POA: Diagnosis not present

## 2019-02-01 DIAGNOSIS — E559 Vitamin D deficiency, unspecified: Secondary | ICD-10-CM | POA: Diagnosis not present

## 2019-02-01 DIAGNOSIS — E7849 Other hyperlipidemia: Secondary | ICD-10-CM | POA: Diagnosis not present

## 2019-02-01 DIAGNOSIS — E119 Type 2 diabetes mellitus without complications: Secondary | ICD-10-CM | POA: Diagnosis not present

## 2019-02-01 DIAGNOSIS — F330 Major depressive disorder, recurrent, mild: Secondary | ICD-10-CM | POA: Diagnosis not present

## 2019-02-01 DIAGNOSIS — D518 Other vitamin B12 deficiency anemias: Secondary | ICD-10-CM | POA: Diagnosis not present

## 2019-02-01 DIAGNOSIS — Z79899 Other long term (current) drug therapy: Secondary | ICD-10-CM | POA: Diagnosis not present

## 2019-02-01 DIAGNOSIS — F411 Generalized anxiety disorder: Secondary | ICD-10-CM | POA: Diagnosis not present

## 2019-02-08 DIAGNOSIS — G8929 Other chronic pain: Secondary | ICD-10-CM | POA: Diagnosis not present

## 2019-02-08 DIAGNOSIS — M25512 Pain in left shoulder: Secondary | ICD-10-CM | POA: Diagnosis not present

## 2019-02-08 DIAGNOSIS — N183 Chronic kidney disease, stage 3 (moderate): Secondary | ICD-10-CM | POA: Diagnosis not present

## 2019-02-08 DIAGNOSIS — M25511 Pain in right shoulder: Secondary | ICD-10-CM | POA: Diagnosis not present

## 2019-02-13 DIAGNOSIS — E119 Type 2 diabetes mellitus without complications: Secondary | ICD-10-CM | POA: Diagnosis not present

## 2019-02-13 DIAGNOSIS — D518 Other vitamin B12 deficiency anemias: Secondary | ICD-10-CM | POA: Diagnosis not present

## 2019-02-13 DIAGNOSIS — E038 Other specified hypothyroidism: Secondary | ICD-10-CM | POA: Diagnosis not present

## 2019-02-15 DIAGNOSIS — F411 Generalized anxiety disorder: Secondary | ICD-10-CM | POA: Diagnosis not present

## 2019-02-15 DIAGNOSIS — F330 Major depressive disorder, recurrent, mild: Secondary | ICD-10-CM | POA: Diagnosis not present

## 2019-05-14 ENCOUNTER — Emergency Department (HOSPITAL_COMMUNITY): Payer: Medicare HMO

## 2019-05-14 ENCOUNTER — Encounter (HOSPITAL_COMMUNITY): Payer: Self-pay | Admitting: *Deleted

## 2019-05-14 ENCOUNTER — Other Ambulatory Visit: Payer: Self-pay

## 2019-05-14 ENCOUNTER — Observation Stay (HOSPITAL_COMMUNITY)
Admission: EM | Admit: 2019-05-14 | Discharge: 2019-05-16 | Disposition: A | Discharge status: Home / Self Care | Payer: Medicare HMO | Attending: Internal Medicine | Admitting: Internal Medicine

## 2019-05-14 DIAGNOSIS — I129 Hypertensive chronic kidney disease with stage 1 through stage 4 chronic kidney disease, or unspecified chronic kidney disease: Secondary | ICD-10-CM | POA: Diagnosis not present

## 2019-05-14 DIAGNOSIS — F0390 Unspecified dementia without behavioral disturbance: Secondary | ICD-10-CM | POA: Diagnosis not present

## 2019-05-14 DIAGNOSIS — Y939 Activity, unspecified: Secondary | ICD-10-CM | POA: Diagnosis not present

## 2019-05-14 DIAGNOSIS — S0101XA Laceration without foreign body of scalp, initial encounter: Secondary | ICD-10-CM | POA: Diagnosis not present

## 2019-05-14 DIAGNOSIS — E039 Hypothyroidism, unspecified: Secondary | ICD-10-CM | POA: Diagnosis not present

## 2019-05-14 DIAGNOSIS — Y999 Unspecified external cause status: Secondary | ICD-10-CM | POA: Insufficient documentation

## 2019-05-14 DIAGNOSIS — Z23 Encounter for immunization: Secondary | ICD-10-CM | POA: Diagnosis not present

## 2019-05-14 DIAGNOSIS — Z20828 Contact with and (suspected) exposure to other viral communicable diseases: Secondary | ICD-10-CM | POA: Diagnosis not present

## 2019-05-14 DIAGNOSIS — W19XXXA Unspecified fall, initial encounter: Secondary | ICD-10-CM | POA: Insufficient documentation

## 2019-05-14 DIAGNOSIS — Z79899 Other long term (current) drug therapy: Secondary | ICD-10-CM | POA: Diagnosis not present

## 2019-05-14 DIAGNOSIS — W1830XA Fall on same level, unspecified, initial encounter: Secondary | ICD-10-CM | POA: Diagnosis not present

## 2019-05-14 DIAGNOSIS — S72421A Displaced fracture of lateral condyle of right femur, initial encounter for closed fracture: Secondary | ICD-10-CM | POA: Insufficient documentation

## 2019-05-14 DIAGNOSIS — S72491A Other fracture of lower end of right femur, initial encounter for closed fracture: Secondary | ICD-10-CM

## 2019-05-14 DIAGNOSIS — N183 Chronic kidney disease, stage 3 (moderate): Secondary | ICD-10-CM | POA: Insufficient documentation

## 2019-05-14 DIAGNOSIS — Y92129 Unspecified place in nursing home as the place of occurrence of the external cause: Secondary | ICD-10-CM | POA: Insufficient documentation

## 2019-05-14 DIAGNOSIS — S72421D Displaced fracture of lateral condyle of right femur, subsequent encounter for closed fracture with routine healing: Secondary | ICD-10-CM | POA: Diagnosis present

## 2019-05-14 HISTORY — DX: Other reduced mobility: Z74.09

## 2019-05-14 HISTORY — DX: Major depressive disorder, single episode, unspecified: F32.9

## 2019-05-14 HISTORY — DX: Unspecified dementia, unspecified severity, without behavioral disturbance, psychotic disturbance, mood disturbance, and anxiety: F03.90

## 2019-05-14 HISTORY — DX: Hypokalemia: E87.6

## 2019-05-14 HISTORY — DX: Hypothyroidism, unspecified: E03.9

## 2019-05-14 HISTORY — DX: Insomnia, unspecified: G47.00

## 2019-05-14 HISTORY — DX: Essential (primary) hypertension: I10

## 2019-05-14 HISTORY — DX: Gastro-esophageal reflux disease without esophagitis: K21.9

## 2019-05-14 HISTORY — DX: Chronic kidney disease, stage 3 unspecified: N18.30

## 2019-05-14 HISTORY — DX: Age-related osteoporosis without current pathological fracture: M81.0

## 2019-05-14 HISTORY — DX: Opioid dependence, uncomplicated: F11.20

## 2019-05-14 LAB — COMPREHENSIVE METABOLIC PANEL
ALT: 11 U/L (ref 0–44)
AST: 16 U/L (ref 15–41)
Albumin: 3.5 g/dL (ref 3.5–5.0)
Alkaline Phosphatase: 60 U/L (ref 38–126)
Anion gap: 9 (ref 5–15)
BUN: 35 mg/dL — ABNORMAL HIGH (ref 8–23)
CO2: 32 mmol/L (ref 22–32)
Calcium: 8.9 mg/dL (ref 8.9–10.3)
Chloride: 101 mmol/L (ref 98–111)
Creatinine, Ser: 1.57 mg/dL — ABNORMAL HIGH (ref 0.44–1.00)
GFR calc Af Amer: 34 mL/min — ABNORMAL LOW (ref >60)
GFR calc non Af Amer: 29 mL/min — ABNORMAL LOW (ref >60)
Glucose, Bld: 130 mg/dL — ABNORMAL HIGH (ref 70–99)
Potassium: 4.7 mmol/L (ref 3.5–5.1)
Sodium: 142 mmol/L (ref 135–145)
Total Bilirubin: 0.3 mg/dL (ref 0.3–1.2)
Total Protein: 6.5 g/dL (ref 6.5–8.1)

## 2019-05-14 LAB — CBC WITH DIFFERENTIAL/PLATELET
Abs Immature Granulocytes: 0.04 10*3/uL (ref 0.00–0.07)
Basophils Absolute: 0 10*3/uL (ref 0.0–0.1)
Basophils Relative: 1 %
Eosinophils Absolute: 0.1 10*3/uL (ref 0.0–0.5)
Eosinophils Relative: 2 %
HCT: 30.3 % — ABNORMAL LOW (ref 36.0–46.0)
Hemoglobin: 9.6 g/dL — ABNORMAL LOW (ref 12.0–15.0)
Immature Granulocytes: 1 %
Lymphocytes Relative: 16 %
Lymphs Abs: 0.9 10*3/uL (ref 0.7–4.0)
MCH: 32.1 pg (ref 26.0–34.0)
MCHC: 31.7 g/dL (ref 30.0–36.0)
MCV: 101.3 fL — ABNORMAL HIGH (ref 80.0–100.0)
Monocytes Absolute: 0.5 10*3/uL (ref 0.1–1.0)
Monocytes Relative: 9 %
Neutro Abs: 4.2 10*3/uL (ref 1.7–7.7)
Neutrophils Relative %: 71 %
Platelets: 238 10*3/uL (ref 150–400)
RBC: 2.99 MIL/uL — ABNORMAL LOW (ref 3.87–5.11)
RDW: 13.1 % (ref 11.5–15.5)
WBC: 5.8 10*3/uL (ref 4.0–10.5)
nRBC: 0 % (ref 0.0–0.2)

## 2019-05-14 LAB — URINALYSIS, ROUTINE W REFLEX MICROSCOPIC
Bacteria, UA: NONE SEEN
Bilirubin Urine: NEGATIVE
Glucose, UA: NEGATIVE mg/dL
Hgb urine dipstick: NEGATIVE
Ketones, ur: NEGATIVE mg/dL
Nitrite: NEGATIVE
Protein, ur: NEGATIVE mg/dL
Specific Gravity, Urine: 1.014 (ref 1.005–1.030)
pH: 7 (ref 5.0–8.0)

## 2019-05-14 LAB — SARS CORONAVIRUS 2 BY RT PCR (HOSPITAL ORDER, PERFORMED IN ~~LOC~~ HOSPITAL LAB): SARS Coronavirus 2: NEGATIVE

## 2019-05-14 MED ORDER — METHADONE HCL 10 MG PO TABS
10.0000 mg | ORAL_TABLET | Freq: Once | ORAL | Status: AC
  Administered 2019-05-14: 10 mg via ORAL
  Filled 2019-05-14: qty 1

## 2019-05-14 MED ORDER — TETANUS-DIPHTH-ACELL PERTUSSIS 5-2.5-18.5 LF-MCG/0.5 IM SUSP
0.5000 mL | Freq: Once | INTRAMUSCULAR | Status: AC
  Administered 2019-05-14: 0.5 mL via INTRAMUSCULAR
  Filled 2019-05-14: qty 0.5

## 2019-05-14 MED ORDER — MIRTAZAPINE 15 MG PO TABS
15.0000 mg | ORAL_TABLET | Freq: Every day | ORAL | Status: DC
  Administered 2019-05-14: 15 mg via ORAL
  Filled 2019-05-14: qty 1

## 2019-05-14 MED ORDER — ONDANSETRON HCL 4 MG/2ML IJ SOLN
4.0000 mg | Freq: Once | INTRAMUSCULAR | Status: AC
  Administered 2019-05-14: 4 mg via INTRAVENOUS
  Filled 2019-05-14: qty 2

## 2019-05-14 MED ORDER — DIVALPROEX SODIUM 125 MG PO DR TAB
125.0000 mg | DELAYED_RELEASE_TABLET | Freq: Three times a day (TID) | ORAL | Status: DC
  Administered 2019-05-14 – 2019-05-16 (×4): 125 mg via ORAL
  Filled 2019-05-14 (×11): qty 1

## 2019-05-14 MED ORDER — MORPHINE SULFATE (PF) 4 MG/ML IV SOLN
4.0000 mg | Freq: Once | INTRAVENOUS | Status: AC
  Administered 2019-05-14: 4 mg via INTRAVENOUS
  Filled 2019-05-14: qty 1

## 2019-05-14 MED ORDER — METHADONE HCL 10 MG PO TABS
30.0000 mg | ORAL_TABLET | Freq: Once | ORAL | Status: AC
  Administered 2019-05-14: 30 mg via ORAL
  Filled 2019-05-14: qty 3

## 2019-05-14 MED ORDER — HYDROMORPHONE HCL 1 MG/ML IJ SOLN
0.5000 mg | Freq: Once | INTRAMUSCULAR | Status: AC
  Administered 2019-05-14: 0.5 mg via INTRAVENOUS
  Filled 2019-05-14: qty 1

## 2019-05-14 MED ORDER — LIDOCAINE-EPINEPHRINE (PF) 2 %-1:200000 IJ SOLN
10.0000 mL | Freq: Once | INTRAMUSCULAR | Status: AC
  Administered 2019-05-14: 10 mL
  Filled 2019-05-14: qty 10

## 2019-05-14 MED ORDER — MORPHINE SULFATE (PF) 2 MG/ML IV SOLN
2.0000 mg | Freq: Once | INTRAVENOUS | Status: AC
  Administered 2019-05-14: 2 mg via INTRAVENOUS
  Filled 2019-05-14: qty 1

## 2019-05-14 NOTE — H&P (Signed)
History and Physical    Melissa Hill YTK:[id number] DOB: November 29, 1929 DOA: 05/14/2019  PCP: Jacquiline Doe, NP   Patient coming from: [facility].  I have personally briefly reviewed patient's old medical records in [facility]  Chief Complaint: Fall  HPI: Melissa Hill is a 85 y.o. female with medical history significant for dementia, CKD 3, hypothyroidism, hypertension, depression, opioid dependency who was brought to the ED via EMS reports of a fall, with a laceration to the right side of her face.   My evaluation, patient is awake and alert, verbalizing and answers simple questions appropriately.  Patient complains of pain to the right side of her face and right leg pain.  She does not remember the events surrounding the fall. But she denies difficulty breathing, no chest pain, no shortness of breath, no vomiting no loose stools. Called nursing facility to get further history but got the voicemail box.  ED Course: Temperature 99.3.  Blood pressure systolic 557D to 220U.  Hemoglobin 9.6.  Creatinine 1.57.  No prior blood work to compare.  UA with trace leukocytes.  Head and cervical CT-no intracranial abnormality.  Advance cervical spondylosis.  No fracture.  CT of the right knee shows displaced/minute third fracture within the lateral femoral condyle. EDP talked to Dr. Aline Brochure, patient is not a surgical candidate.  Hospitalist to admit for pain control.  Review of Systems: As per HPI all other systems reviewed and negative.  Past Medical History:  Diagnosis Date   CKD (chronic kidney disease), stage III (HCC)    Dementia (HCC)    GERD (gastroesophageal reflux disease)    HTN (hypertension)    Hypokalemia    Hypothyroid    Insomnia    MDD (major depressive disorder)    Opioid dependence ([place])    Osteoporosis    Reduced mobility    uses a walker for ambulation    History reviewed. No pertinent surgical history.   reports that she  has quit smoking. She has never used smokeless tobacco. She reports previous alcohol use. She reports previous drug use.  Allergies  Allergen Reactions   Haldol [Haloperidol]     UNKNOWN-LISTED ON MAR   Talwin [Pentazocine]     UNKNOWN-LISTED ON MAR   Thorazine [Chlorpromazine]     UNKNOWN-LISTED ON MAR   No pertinent family history.  Prior to Admission medications   Medication Sig Start Date End Date Taking? Authorizing Provider  acetaminophen (TYLENOL) 500 MG tablet Take 1,000 mg by mouth every 6 (six) hours as needed for mild pain or moderate pain.   Yes [provider]  alendronate (FOSAMAX) 70 MG tablet Take 70 mg by mouth every Monday. Take with a full glass of water on an empty stomach.   Yes [provider]  amLODipine (NORVASC) 10 MG tablet Take 10 mg by mouth every morning.   Yes [provider]  diclofenac sodium (VOLTAREN) 1 % GEL Apply 2 g topically 2 (two) times daily as needed (applied to knees, lower back, and left shoulder).   Yes [provider]  divalproex (DEPAKOTE) 125 MG DR tablet Take 125 mg by mouth 3 (three) times daily.   Yes [provider]  docusate sodium (COLACE) 100 MG capsule Take 100 mg by mouth daily as needed for mild constipation.   Yes [provider]  DULoxetine (CYMBALTA) 30 MG capsule Take 90 mg by mouth every morning.   Yes [provider]  fluticasone (FLONASE) 50 MCG/ACT  nasal spray Place 1 spray into both nostrils 2 (two) times a day.   Yes [provider]  galantamine (RAZADYNE) 12 MG tablet Take 12 mg by mouth every morning.   Yes [provider]  hydrochlorothiazide (HYDRODIURIL) 25 MG tablet Take 25 mg by mouth every morning.   Yes [provider]  levothyroxine (SYNTHROID) 100 MCG tablet Take 100 mcg by mouth daily before breakfast.   Yes [provider]  lidocaine (LIDODERM) 5 % Place 1 patch onto the skin every 12 (twelve) hours. Applied to  right shoulder for 12 hours on and then removed for 12 hours off   Yes [provider]  lisinopril (ZESTRIL) 10 MG tablet Take 10 mg by mouth every morning.   Yes [provider]  loperamide (IMODIUM) 2 MG capsule Take 2 mg by mouth See admin instructions. Take one capsule with each loose stool up to 8 doses as needed for diarrhea   Yes [provider]  methadone (DOLOPHINE) 10 MG tablet Take 40 mg by mouth 2 (two) times a day. 8a and 6p   Yes [provider]  mirtazapine (REMERON) 15 MG tablet Take 15 mg by mouth at bedtime.   Yes [provider]  omeprazole (PRILOSEC) 20 MG capsule Take 20 mg by mouth every morning.   Yes [provider]  traZODone (DESYREL) 150 MG tablet Take 150 mg by mouth at bedtime.   Yes [provider]  triamcinolone cream (KENALOG) 0.1 % Apply 1 application topically 2 (two) times daily. Apply to right lower extremity rash   Yes [provider]    Physical Exam: Vitals:   05/14/19 1843 05/14/19 1934 05/14/19 2030 05/14/19 2100  BP: (!) 189/97 (!) 179/77 (!) 179/74 (!) 180/92  Pulse: 80 81 67 68  Resp: (!) 21 16 15  (!) 36  Temp:      TempSrc:      SpO2: 99% 91% (!) 87% (!) 76%  Weight:      Height:        Constitutional: Thin appearing, calm, comfortable, 3 staples to left frontal region above forehead, applied in ED Vitals:   05/14/19 1843 05/14/19 1934 05/14/19 2030 05/14/19 2100  BP: (!) 189/97 (!) 179/77 (!) 179/74 (!) 180/92  Pulse: 80 81 67 68  Resp: (!) 21 16 15  (!) 36  Temp:      TempSrc:      SpO2: 99% 91% (!) 87% (!) 76%  Weight:      Height:       Eyes: PERRL, lids and conjunctivae normal ENMT: Mucous membranes are moist. Posterior pharynx clear of any exudate or lesions. Neck: normal, supple, no masses, no thyromegaly Respiratory: clear to auscultation bilaterally, no wheezing, no crackles. Normal respiratory effort. No accessory muscle use.  Cardiovascular: Regular  rate and rhythm, 3/6 systolic murmurs, no rubs / gallops. No extremity edema. 2+ pedal pulses.   Abdomen: no tenderness, no masses palpated. No hepatosplenomegaly. Bowel sounds positive.  Musculoskeletal: no clubbing / cyanosis.  Right knee immobilizer.  Good ROM, no contractures. Normal muscle tone.  Skin: no rashes, lesions, ulcers. No induration Neurologic: CN 2-12 grossly intact.  Moving extremities spontaneously except right lower extremity. Psychiatric: Normal judgment and insight. Alert and oriented x person and place.  Labs on Admission: I have personally reviewed following labs and imaging studies  CBC: Recent Labs  Lab 05/14/19 1706  WBC 5.8  NEUTROABS 4.2  HGB 9.6*  HCT 30.3*  MCV 101.3*  PLT  238   Basic Metabolic Panel: Recent Labs  Lab 05/14/19 1706  NA 142  K 4.7  CL 101  CO2 32  GLUCOSE 130*  BUN 35*  CREATININE 1.57*  CALCIUM 8.9   Liver Function Tests: Recent Labs  Lab 05/14/19 1706  AST 16  ALT 11  ALKPHOS 60  BILITOT 0.3  PROT 6.5  ALBUMIN 3.5   Urine analysis:    Component Value Date/Time   COLORURINE YELLOW 05/14/2019 1726   APPEARANCEUR CLEAR 05/14/2019 1726   LABSPEC 1.014 05/14/2019 1726   PHURINE 7.0 05/14/2019 1726   GLUCOSEU NEGATIVE 05/14/2019 1726   HGBUR NEGATIVE 05/14/2019 1726   BILIRUBINUR NEGATIVE 05/14/2019 1726   KETONESUR NEGATIVE 05/14/2019 1726   PROTEINUR NEGATIVE 05/14/2019 1726   NITRITE NEGATIVE 05/14/2019 1726   LEUKOCYTESUR TRACE (A) 05/14/2019 1726    Radiological Exams on Admission: Dg Pelvis 1-2 Views  Result Date: 05/14/2019 CLINICAL DATA:  Distal RIGHT femoral and RIGHT knee pain EXAM: PELVIS - 1-2 VIEW COMPARISON:  None FINDINGS: Marked osseous demineralization. Hip and SI joint spaces preserved. No fracture, dislocation, or bone destruction. Degenerative disc disease changes at visualized lumbar spine. Extensive atherosclerotic calcifications and scattered phleboliths. IMPRESSION: Marked osseous  demineralization and lower lumbar degenerative changes without acute bony abnormalities. Electronically Signed   By: Ulyses SouthwardMark  Boles M.D.   On: 05/14/2019 19:06   Ct Head Wo Contrast  Result Date: 05/14/2019 CLINICAL DATA:  Fall.  Head injury EXAM: CT HEAD WITHOUT CONTRAST CT CERVICAL SPINE WITHOUT CONTRAST TECHNIQUE: Multidetector CT imaging of the head and cervical spine was performed following the standard protocol without intravenous contrast. Multiplanar CT image reconstructions of the cervical spine were also generated. COMPARISON:  None. FINDINGS: CT HEAD FINDINGS Brain: Generalized atrophy. Extensive white matter disease bilaterally which is symmetric and appears chronic. Negative for acute infarct, hemorrhage, mass. No fluid collection or midline shift. Vascular: Negative for hyperdense vessel. Atherosclerotic calcification in the cavernous carotid bilaterally. Skull: Negative for fracture Sinuses/Orbits: Mild sinus mucosal disease. Bilateral cataract surgery Other: None CT CERVICAL SPINE FINDINGS Alignment: Mild anterolisthesis C3-4 and C4-5 and C7-T1 Skull base and vertebrae: Negative for fracture Soft tissues and spinal canal: Carotid artery atherosclerotic disease. Negative for mass or adenopathy. Disc levels: Multilevel disc and facet degeneration throughout the cervical spine and upper thoracic spine. Extensive spurring. Foraminal encroachment bilaterally at C3-4, C4-5, C5-6, C6-7. Upper chest: Lung apices clear bilaterally Other: None IMPRESSION: 1. No acute intracranial abnormality. Atrophy and extensive white matter disease most likely chronic microvascular ischemia 2. Advanced cervical spondylosis.  Negative for fracture Electronically Signed   By: Marlan Palauharles  Clark M.D.   On: 05/14/2019 19:01   Ct Cervical Spine Wo Contrast  Result Date: 05/14/2019 CLINICAL DATA:  Fall.  Head injury EXAM: CT HEAD WITHOUT CONTRAST CT CERVICAL SPINE WITHOUT CONTRAST TECHNIQUE: Multidetector CT imaging of the head  and cervical spine was performed following the standard protocol without intravenous contrast. Multiplanar CT image reconstructions of the cervical spine were also generated. COMPARISON:  None. FINDINGS: CT HEAD FINDINGS Brain: Generalized atrophy. Extensive white matter disease bilaterally which is symmetric and appears chronic. Negative for acute infarct, hemorrhage, mass. No fluid collection or midline shift. Vascular: Negative for hyperdense vessel. Atherosclerotic calcification in the cavernous carotid bilaterally. Skull: Negative for fracture Sinuses/Orbits: Mild sinus mucosal disease. Bilateral cataract surgery Other: None CT CERVICAL SPINE FINDINGS Alignment: Mild anterolisthesis C3-4 and C4-5 and C7-T1 Skull base and vertebrae: Negative for fracture Soft tissues and spinal canal: Carotid artery atherosclerotic disease.  Negative for mass or adenopathy. Disc levels: Multilevel disc and facet degeneration throughout the cervical spine and upper thoracic spine. Extensive spurring. Foraminal encroachment bilaterally at C3-4, C4-5, C5-6, C6-7. Upper chest: Lung apices clear bilaterally Other: None IMPRESSION: 1. No acute intracranial abnormality. Atrophy and extensive white matter disease most likely chronic microvascular ischemia 2. Advanced cervical spondylosis.  Negative for fracture Electronically Signed   By: Marlan Palauharles  Clark M.D.   On: 05/14/2019 19:01   Ct Knee Right Wo Contrast  Result Date: 05/14/2019 CLINICAL DATA:  Fall today. Per x-ray report, displaced fracture of lateral femoral condyle. EXAM: CT OF THE RIGHT KNEE WITHOUT CONTRAST TECHNIQUE: Multidetector CT imaging of the RIGHT knee was performed according to the standard protocol. Multiplanar CT image reconstructions were also generated. COMPARISON:  None. FINDINGS: Bones/Joint/Cartilage A displaced fracture is confirmed within the lateral femoral condyle, comminuted with extension to the posterior and inferior cortical margins (best seen on  sagittal series 6, image 36) and extending centrally and anteriorly to the midline anterior cortex where there is approximately 6 mm fracture diastasis (coronal series 5, image 49). No fracture extension to the medial femoral condyle. Tibial plateau is intact. Proximal fibula is intact. Associated joint effusion with lipohemarthrosis level in the suprapatellar bursa. Questionable nondisplaced fractures within the medial and lateral portion of the patella, but not convincing. IMPRESSION: 1. Displaced/comminuted fracture within the lateral femoral condyle, with extension to the posterior and inferior cortex, and extending centrally and inferiorly to the midline femur where there is approximately 6 mm fracture diastasis at the articular surface. 2. Associated joint effusion with lipohemarthrosis level in the suprapatellar bursa. 3. Questionable nondisplaced fracture within the medial and lateral aspects of the patella, but not convincing, more likely prominent neutral vessel foramina. Electronically Signed   By: Bary RichardStan  Maynard M.D.   On: 05/14/2019 20:33   Dg Femur Min 2 Views Right  Result Date: 05/14/2019 CLINICAL DATA:  Acute pain and swelling. Status post fall. EXAM: RIGHT FEMUR 2 VIEWS COMPARISON:  None. FINDINGS: Slightly displaced fracture of the lateral femoral condyle, without convincing extension to the underlying articular surface. Medial femoral condyle appears intact. Proximal tibia and fibula appear intact and normally aligned. Proximal femur appears intact and normally aligned. Prominent joint effusion within the suprapatellar bursa. Extensive vascular calcifications throughout the RIGHT lower extremity. IMPRESSION: 1. Displaced fracture of the lateral femoral condyle, without convincing extension to the underlying articular surface. 2. Prominent joint effusion within the suprapatellar bursa. 3. Extensive vascular calcifications. Electronically Signed   By: Bary RichardStan  Maynard M.D.   On: 05/14/2019 18:59     EKG: Independently reviewed.  Sinus rhythm.  QTc 499.  Nonspecific T wave abnormalities in aVL and V2 only.  No old EKG to compare.  Assessment/Plan Active Problems:   Displaced fracture of lateral condyle of right femur, initial encounter for closed fracture (HCC)    Right femur fracture-sustained from likely mechanical fall. EDP talked to Dr. Romeo AppleHarrison.  Recommended for further evaluation-displaced/comminuted fracture within the lateral femoral condyle..(see detailed report). Patient not a surgical candidate.  Unable to get further details from nursing home. -Pain control, resume home methadone, PRN Dilaudid 0.5mg   -Nonweightbearing -Knee immobilizer  Probable acute kidney injury versus CKD 3- Cr 1.57.  No baseline to compare in epic or care everywhere. -Hold home HCTZ, lisinopril - LR 75cc/hr x 15hrs -BMP a.m.  Opioid dependency-home meds methadone 40 mg twice daily.  With high opiate tolerance. -Resume home methadone  Hypertension-elevated likely secondary to pain. -PRN labetalol   -Resume home Norvasc -Hold home HCTZ and lisinopril  Hypothyroidism-  -Resume home Synthroid  Dementia, depression - resume home nightly trazodone, mirtazapine, Cymbalta, Depakote, galantamine   DVT prophylaxis: SCDs Code Status: Full Family Communication: None at bedside  disposition Plan: 1 to 2 days Consults called: Orthopedic surgery Admission status:  Obs, Med-surgery   Onnie Boer MD [facility]  05/14/2019, 10:35 PM

## 2019-05-14 NOTE — ED Notes (Signed)
Pt transported to CT ?

## 2019-05-14 NOTE — ED Notes (Addendum)
Pt's daughter arrived and is at bedside. EDP in to speak with patient's daughter.

## 2019-05-14 NOTE — ED Triage Notes (Signed)
Per EMS pt fell and has a laceration to the right side of her head and ? Right hip injury

## 2019-05-14 NOTE — ED Notes (Signed)
Lab at bedside

## 2019-05-14 NOTE — ED Notes (Signed)
Ice pack applied on RT knee.

## 2019-05-14 NOTE — ED Notes (Signed)
ED TO INPATIENT HANDOFF REPORT  ED Nurse Name and Phone #: [phone]  S Name/Age/Gender Melissa Hill 85 y.o. female Room/Bed: APA18/APA18  Code Status   Code Status: Not on file  Home/SNF/Other Rehab Patient oriented to: self Is this baseline? Yes   Triage Complete: Triage complete  Chief Complaint Leg injury  Triage Note Per EMS pt fell and has a laceration to the right side of her head and ? Right hip injury   Allergies Allergies  Allergen Reactions  . Haldol [Haloperidol]     UNKNOWN-LISTED ON MAR  . Talwin [Pentazocine]     UNKNOWN-LISTED ON MAR  . Thorazine [Chlorpromazine]     UNKNOWN-LISTED ON MAR    Level of Care/Admitting Diagnosis ED Disposition    ED Disposition Condition Comment   Admit  Hospital Area: [facility] [[id number]]  Level of Care: Med-Surg [16]  Covid Evaluation: Asymptomatic Screening Protocol (No Symptoms)  Diagnosis: Displaced fracture of lateral condyle of right femur, initial encounter for closed fracture [facility]) [[id number]]  Admitting Physician: Onnie Boer [phone]  Attending Physician: Onnie Boer Xenia.Douglas  PT Class (Do Not Modify): Observation [104]  PT Acc Code (Do Not Modify): Observation [10022]       B Medical/Surgery History Past Medical History:  Diagnosis Date  . CKD (chronic kidney disease), stage III (HCC)   . Dementia (HCC)   . GERD (gastroesophageal reflux disease)   . HTN (hypertension)   . Hypokalemia   . Hypothyroid   . Insomnia   . MDD (major depressive disorder)   . Opioid dependence (HCC)   . Osteoporosis   . Reduced mobility    uses a walker for ambulation   History reviewed. No pertinent surgical history.   A IV Location/Drains/Wounds Patient Lines/Drains/Airways Status   Active Line/Drains/Airways    Name:   Placement date:   Placement time:   Site:   Days:   External Urinary Catheter   05/14/19    1702    -   less than 1          Intake/Output Last 24 hours No  intake or output data in the 24 hours ending 05/14/19 2226  Labs/Imaging Results for orders placed or performed during the hospital encounter of 05/14/19 (from the past 48 hour(s))  Comprehensive metabolic panel     Status: Abnormal   Collection Time: 05/14/19  5:06 PM  Result Value Ref Range   Sodium 142 135 - 145 mmol/L   Potassium 4.7 3.5 - 5.1 mmol/L   Chloride 101 98 - 111 mmol/L   CO2 32 22 - 32 mmol/L   Glucose, Bld 130 (H) 70 - 99 mg/dL   BUN 35 (H) 8 - 23 mg/dL   Creatinine, Ser 9.56 (H) 0.44 - 1.00 mg/dL   Calcium 8.9 8.9 - 21.3 mg/dL   Total Protein 6.5 6.5 - 8.1 g/dL   Albumin 3.5 3.5 - 5.0 g/dL   AST 16 15 - 41 U/L   ALT 11 0 - 44 U/L   Alkaline Phosphatase 60 38 - 126 U/L   Total Bilirubin 0.3 0.3 - 1.2 mg/dL   GFR calc non Af Amer 29 (L) >60 mL/min   GFR calc Af Amer 34 (L) >60 mL/min   Anion gap 9 5 - 15    Comment: Performed at [facility], [address], [place], [place] [address]  CBC with Differential     Status: Abnormal   Collection Time: 05/14/19  5:06 PM  Result Value Ref Range   WBC 5.8 4.0 - 10.5 K/uL   RBC 2.99 (L) 3.87 - 5.11 MIL/uL   Hemoglobin 9.6 (L) 12.0 - 15.0 g/dL   HCT 16.130.3 (L) 09.636.0 - 04.546.0 %   MCV 101.3 (H) 80.0 - 100.0 fL   MCH 32.1 26.0 - 34.0 pg   MCHC 31.7 30.0 - 36.0 g/dL   RDW 40.913.1 81.111.5 - 91.415.5 %   Platelets 238 150 - 400 K/uL   nRBC 0.0 0.0 - 0.2 %   Neutrophils Relative % 71 %   Neutro Abs 4.2 1.7 - 7.7 K/uL   Lymphocytes Relative 16 %   Lymphs Abs 0.9 0.7 - 4.0 K/uL   Monocytes Relative 9 %   Monocytes Absolute 0.5 0.1 - 1.0 K/uL   Eosinophils Relative 2 %   Eosinophils Absolute 0.1 0.0 - 0.5 K/uL   Basophils Relative 1 %   Basophils Absolute 0.0 0.0 - 0.1 K/uL   Immature Granulocytes 1 %   Abs Immature Granulocytes 0.04 0.00 - 0.07 K/uL    Comment: Performed at [facility], [address] [place], [place] [address]  Urinalysis, Routine w reflex microscopic     Status: Abnormal   Collection Time: 05/14/19  5:26 PM   Result Value Ref Range   Color, Urine YELLOW YELLOW   APPearance CLEAR CLEAR   Specific Gravity, Urine 1.014 1.005 - 1.030   pH 7.0 5.0 - 8.0   Glucose, UA NEGATIVE NEGATIVE mg/dL   Hgb urine dipstick NEGATIVE NEGATIVE   Bilirubin Urine NEGATIVE NEGATIVE   Ketones, ur NEGATIVE NEGATIVE mg/dL   Protein, ur NEGATIVE NEGATIVE mg/dL   Nitrite NEGATIVE NEGATIVE   Leukocytes,Ua TRACE (A) NEGATIVE   RBC / HPF 0-5 0 - 5 RBC/hpf   WBC, UA 0-5 0 - 5 WBC/hpf   Bacteria, UA NONE SEEN NONE SEEN    Comment: Performed at [facility], [address] [place], [place] [address]  SARS Coronavirus 2 (CEPHEID - Performed in [facility] hospital lab), Hosp Order     Status: None   Collection Time: 05/14/19  8:25 PM   Specimen: Nasopharyngeal Swab  Result Value Ref Range   SARS Coronavirus 2 NEGATIVE NEGATIVE    Comment: (NOTE) If result is NEGATIVE SARS-CoV-2 target nucleic acids are NOT DETECTED. The SARS-CoV-2 RNA is generally detectable in upper and lower  respiratory specimens during the acute phase of infection. The lowest  concentration of SARS-CoV-2 viral copies this assay can detect is 250  copies / mL. A negative result does not preclude SARS-CoV-2 infection  and should not be used as the sole basis for treatment or other  patient management decisions.  A negative result may occur with  improper specimen collection / handling, submission of specimen other  than nasopharyngeal swab, presence of viral mutation(s) within the  areas targeted by this assay, and inadequate number of viral copies  (<250 copies / mL). A negative result must be combined with clinical  observations, patient history, and epidemiological information. If result is POSITIVE SARS-CoV-2 target nucleic acids are DETECTED. The SARS-CoV-2 RNA is generally detectable in upper and lower  respiratory specimens dur ing the acute phase of infection.  Positive  results are indicative of active infection with SARS-CoV-2.   Clinical  correlation with patient history and other diagnostic information is  necessary to determine patient infection status.  Positive results do  not rule out bacterial infection or co-infection with other viruses. If result is PRESUMPTIVE POSTIVE SARS-CoV-2  nucleic acids MAY BE PRESENT.   A presumptive positive result was obtained on the submitted specimen  and confirmed on repeat testing.  While 2019 novel coronavirus  (SARS-CoV-2) nucleic acids may be present in the submitted sample  additional confirmatory testing may be necessary for epidemiological  and / or clinical management purposes  to differentiate between  SARS-CoV-2 and other Sarbecovirus currently known to infect humans.  If clinically indicated additional testing with an alternate test  methodology [phone]([id number]) is advised. The SARS-CoV-2 RNA is generally  detectable in upper and lower respiratory sp ecimens during the acute  phase of infection. The expected result is Negative. Fact Sheet for Patients:  BoilerBrush.com.cyhttps://www.fda.gov/media/136312/download Fact Sheet for Healthcare Providers: https://pope.com/https://www.fda.gov/media/136313/download This test is not yet approved or cleared by the [place] FDA and has been authorized for detection and/or diagnosis of SARS-CoV-2 by FDA under an Emergency Use Authorization (EUA).  This EUA will remain in effect (meaning this test can be used) for the duration of the COVID-19 declaration under Section 564(b)(1) of the Act, 21 U.S.C. section 360bbb-3(b)(1), unless the authorization is terminated or revoked sooner. Performed at [facility], [address] [place], [place] [address]    Dg Pelvis 1-2 Views  Result Date: 05/14/2019 CLINICAL DATA:  Distal RIGHT femoral and RIGHT knee pain EXAM: PELVIS - 1-2 VIEW COMPARISON:  None FINDINGS: Marked osseous demineralization. Hip and SI joint spaces preserved. No fracture, dislocation, or bone destruction. Degenerative disc disease changes at  visualized lumbar spine. Extensive atherosclerotic calcifications and scattered phleboliths. IMPRESSION: Marked osseous demineralization and lower lumbar degenerative changes without acute bony abnormalities. Electronically Signed   By: Ulyses SouthwardMark  Boles M.D.   On: 05/14/2019 19:06   Ct Head Wo Contrast  Result Date: 05/14/2019 CLINICAL DATA:  Fall.  Head injury EXAM: CT HEAD WITHOUT CONTRAST CT CERVICAL SPINE WITHOUT CONTRAST TECHNIQUE: Multidetector CT imaging of the head and cervical spine was performed following the standard protocol without intravenous contrast. Multiplanar CT image reconstructions of the cervical spine were also generated. COMPARISON:  None. FINDINGS: CT HEAD FINDINGS Brain: Generalized atrophy. Extensive white matter disease bilaterally which is symmetric and appears chronic. Negative for acute infarct, hemorrhage, mass. No fluid collection or midline shift. Vascular: Negative for hyperdense vessel. Atherosclerotic calcification in the cavernous carotid bilaterally. Skull: Negative for fracture Sinuses/Orbits: Mild sinus mucosal disease. Bilateral cataract surgery Other: None CT CERVICAL SPINE FINDINGS Alignment: Mild anterolisthesis C3-4 and C4-5 and C7-T1 Skull base and vertebrae: Negative for fracture Soft tissues and spinal canal: Carotid artery atherosclerotic disease. Negative for mass or adenopathy. Disc levels: Multilevel disc and facet degeneration throughout the cervical spine and upper thoracic spine. Extensive spurring. Foraminal encroachment bilaterally at C3-4, C4-5, C5-6, C6-7. Upper chest: Lung apices clear bilaterally Other: None IMPRESSION: 1. No acute intracranial abnormality. Atrophy and extensive white matter disease most likely chronic microvascular ischemia 2. Advanced cervical spondylosis.  Negative for fracture Electronically Signed   By: Marlan Palauharles  Clark M.D.   On: 05/14/2019 19:01   Ct Cervical Spine Wo Contrast  Result Date: 05/14/2019 CLINICAL DATA:  Fall.  Head  injury EXAM: CT HEAD WITHOUT CONTRAST CT CERVICAL SPINE WITHOUT CONTRAST TECHNIQUE: Multidetector CT imaging of the head and cervical spine was performed following the standard protocol without intravenous contrast. Multiplanar CT image reconstructions of the cervical spine were also generated. COMPARISON:  None. FINDINGS: CT HEAD FINDINGS Brain: Generalized atrophy. Extensive white matter disease bilaterally which is symmetric and appears chronic. Negative for acute infarct, hemorrhage, mass. No fluid collection or  midline shift. Vascular: Negative for hyperdense vessel. Atherosclerotic calcification in the cavernous carotid bilaterally. Skull: Negative for fracture Sinuses/Orbits: Mild sinus mucosal disease. Bilateral cataract surgery Other: None CT CERVICAL SPINE FINDINGS Alignment: Mild anterolisthesis C3-4 and C4-5 and C7-T1 Skull base and vertebrae: Negative for fracture Soft tissues and spinal canal: Carotid artery atherosclerotic disease. Negative for mass or adenopathy. Disc levels: Multilevel disc and facet degeneration throughout the cervical spine and upper thoracic spine. Extensive spurring. Foraminal encroachment bilaterally at C3-4, C4-5, C5-6, C6-7. Upper chest: Lung apices clear bilaterally Other: None IMPRESSION: 1. No acute intracranial abnormality. Atrophy and extensive white matter disease most likely chronic microvascular ischemia 2. Advanced cervical spondylosis.  Negative for fracture Electronically Signed   By: Franchot Gallo M.D.   On: 05/14/2019 19:01   Ct Knee Right Wo Contrast  Result Date: 05/14/2019 CLINICAL DATA:  Fall today. Per x-ray report, displaced fracture of lateral femoral condyle. EXAM: CT OF THE RIGHT KNEE WITHOUT CONTRAST TECHNIQUE: Multidetector CT imaging of the RIGHT knee was performed according to the standard protocol. Multiplanar CT image reconstructions were also generated. COMPARISON:  None. FINDINGS: Bones/Joint/Cartilage A displaced fracture is confirmed  within the lateral femoral condyle, comminuted with extension to the posterior and inferior cortical margins (best seen on sagittal series 6, image 36) and extending centrally and anteriorly to the midline anterior cortex where there is approximately 6 mm fracture diastasis (coronal series 5, image 49). No fracture extension to the medial femoral condyle. Tibial plateau is intact. Proximal fibula is intact. Associated joint effusion with lipohemarthrosis level in the suprapatellar bursa. Questionable nondisplaced fractures within the medial and lateral portion of the patella, but not convincing. IMPRESSION: 1. Displaced/comminuted fracture within the lateral femoral condyle, with extension to the posterior and inferior cortex, and extending centrally and inferiorly to the midline femur where there is approximately 6 mm fracture diastasis at the articular surface. 2. Associated joint effusion with lipohemarthrosis level in the suprapatellar bursa. 3. Questionable nondisplaced fracture within the medial and lateral aspects of the patella, but not convincing, more likely prominent neutral vessel foramina. Electronically Signed   By: Franki Cabot M.D.   On: 05/14/2019 20:33   Dg Femur Min 2 Views Right  Result Date: 05/14/2019 CLINICAL DATA:  Acute pain and swelling. Status post fall. EXAM: RIGHT FEMUR 2 VIEWS COMPARISON:  None. FINDINGS: Slightly displaced fracture of the lateral femoral condyle, without convincing extension to the underlying articular surface. Medial femoral condyle appears intact. Proximal tibia and fibula appear intact and normally aligned. Proximal femur appears intact and normally aligned. Prominent joint effusion within the suprapatellar bursa. Extensive vascular calcifications throughout the RIGHT lower extremity. IMPRESSION: 1. Displaced fracture of the lateral femoral condyle, without convincing extension to the underlying articular surface. 2. Prominent joint effusion within the  suprapatellar bursa. 3. Extensive vascular calcifications. Electronically Signed   By: Franki Cabot M.D.   On: 05/14/2019 18:59    Pending Labs Unresulted Labs (From admission, onward)    Start     Ordered   05/14/19 1702  Urine culture  ONCE - STAT,   STAT     05/14/19 1701   Signed and Held  Basic metabolic panel  Tomorrow morning,   R     Signed and Held          Vitals/Pain Today's Vitals   05/14/19 1924 05/14/19 1934 05/14/19 2030 05/14/19 2100  BP:  (!) 179/77 (!) 179/74 (!) 180/92  Pulse:  81 67 68  Resp:  16  15 (!) 36  Temp:      TempSrc:      SpO2:  91% (!) 87% (!) 76%  Weight:      Height:      PainSc: 7        Isolation Precautions No active isolations  Medications Medications  mirtazapine (REMERON) tablet 15 mg (has no administration in time range)  divalproex (DEPAKOTE) DR tablet 125 mg (has no administration in time range)  methadone (DOLOPHINE) tablet 30 mg (has no administration in time range)  ondansetron (ZOFRAN) injection 4 mg (4 mg Intravenous Given 05/14/19 1720)  morphine 2 MG/ML injection 2 mg (2 mg Intravenous Given 05/14/19 1720)  morphine 4 MG/ML injection 4 mg (4 mg Intravenous Given 05/14/19 1756)  lidocaine-EPINEPHrine (XYLOCAINE W/EPI) 2 %-1:200000 (PF) injection 10 mL (10 mLs Infiltration Given 05/14/19 2028)  Tdap (BOOSTRIX) injection 0.5 mL (0.5 mLs Intramuscular Given 05/14/19 2025)  methadone (DOLOPHINE) tablet 10 mg (10 mg Oral Given 05/14/19 2102)  HYDROmorphone (DILAUDID) injection 0.5 mg (0.5 mg Intravenous Given 05/14/19 2102)    Mobility walks with device Moderate fall risk   Focused Assessments    R Recommendations: See Admitting Provider Note  Report given to:   Additional Notes: Pt is confused at this time and has knee immobilizer to the right.

## 2019-05-14 NOTE — ED Provider Notes (Signed)
[facility] EMERGENCY DEPARTMENT Provider Note   CSN: [id number] Arrival date & time: 05/14/19  1606    History   Chief Complaint No chief complaint on file.   Melissa Hill Melissa Hill is a 85 y.o. female.     Melissa Hill  Level 5 caveat due to dementia.  Melissa Hill is a 85 y.o. female, with a history of dementia, CKD stage III, GERD, HTN, osteoporosis, presenting to the ED for evaluation following a fall. She arrives via EMS from [facility] facility.  Melissa complains of pain to the right side of the head as well as pain to the right leg.  Pain is right leg is severe and radiates throughout the right upper leg.  Denies recent illness, fever, chest pain, shortness of breath, numbness, N/V/D, abdominal pain, or any other complaints.    Past Medical History:  Diagnosis Date   CKD (chronic kidney disease), stage III (HCC)    Dementia (HCC)    GERD (gastroesophageal reflux disease)    HTN (hypertension)    Hypokalemia    Hypothyroid    Insomnia    MDD (major depressive disorder)    Opioid dependence ([place])    Osteoporosis    Reduced mobility    uses a walker for ambulation    Melissa Active Problem List   Diagnosis Date Noted   Displaced fracture of lateral condyle of right femur, initial encounter for closed fracture ([place]) 05/14/2019   Fall     History reviewed. No pertinent surgical history.   OB History   No obstetric history on file.      Home Medications    Prior to Admission medications   Medication Sig Start Date End Date Taking? Authorizing Provider  acetaminophen (TYLENOL) 500 MG tablet Take 1,000 mg by mouth every 6 (six) hours as needed for mild pain or moderate pain.   Yes [provider]  alendronate (FOSAMAX) 70 MG tablet Take 70 mg by mouth every Monday. Take with a full glass of water on an empty stomach.   Yes [provider]  amLODipine (NORVASC) 10 MG tablet Take 10 mg by mouth every morning.    Yes [provider]  diclofenac sodium (VOLTAREN) 1 % GEL Apply 2 g topically 2 (two) times daily as needed (applied to knees, lower back, and left shoulder).   Yes [provider]  divalproex (DEPAKOTE) 125 MG DR tablet Take 125 mg by mouth 3 (three) times daily.   Yes [provider]  docusate sodium (COLACE) 100 MG capsule Take 100 mg by mouth daily as needed for mild constipation.   Yes [provider]  DULoxetine (CYMBALTA) 30 MG capsule Take 90 mg by mouth every morning.   Yes [provider]  fluticasone (FLONASE) 50 MCG/ACT nasal spray Place 1 spray into both nostrils 2 (two) times a day.   Yes [provider]  galantamine (RAZADYNE) 12 MG tablet Take 12 mg by mouth every morning.   Yes [provider]  hydrochlorothiazide (HYDRODIURIL) 25 MG tablet Take 25 mg by mouth every morning.   Yes [provider]  levothyroxine (SYNTHROID) 100 MCG tablet Take 100 mcg by mouth daily before breakfast.   Yes [provider]  lidocaine (LIDODERM) 5 % Place 1 patch onto the skin every 12 (twelve) hours. Applied to right shoulder for 12 hours on and then removed for 12 hours off   Yes [provider]  lisinopril (ZESTRIL) 10 MG tablet Take 10 mg  by mouth every morning.   Yes [provider]  loperamide (IMODIUM) 2 MG capsule Take 2 mg by mouth See admin instructions. Take one capsule with each loose stool up to 8 doses as needed for diarrhea   Yes [provider]  methadone (DOLOPHINE) 10 MG tablet Take 40 mg by mouth 2 (two) times a day. 8a and 6p   Yes [provider]  mirtazapine (REMERON) 15 MG tablet Take 15 mg by mouth Melissa bedtime.   Yes [provider]  omeprazole (PRILOSEC) 20 MG capsule Take 20 mg by mouth every morning.   Yes [provider]  traZODone (DESYREL) 150 MG tablet Take 150 mg by mouth Melissa bedtime.   Yes [provider]  triamcinolone cream  (KENALOG) 0.1 % Apply 1 application topically 2 (two) times daily. Apply to right lower extremity rash   Yes [provider]    Family History No family history on file.  Social History Social History   Tobacco Use   Smoking status: Former Smoker   Smokeless tobacco: Never Used  Substance Use Topics   Alcohol use: Not Currently   Drug use: Not Currently     Allergies   Haldol [haloperidol], Talwin [pentazocine], and Thorazine [chlorpromazine]   Review of Systems Review of Systems  Unable to perform ROS: Dementia     Physical Exam Updated Vital Signs BP (!) 175/74 (BP Location: Left Arm)    Pulse 73    Temp 99.3 F (37.4 C) (Oral)    Resp 18    Ht '5\' 3"'$  (1.6 m)    SpO2 97%   Physical Exam Vitals signs and nursing note reviewed.  Constitutional:      General: She is not in acute distress.    Appearance: She is well-developed. She is not diaphoretic.     Interventions: Cervical collar and face mask in place.  HENT:     Head: Normocephalic.     Comments: 1.5 laceration to the right temporal scalp with surrounding tenderness and mild swelling.  No noted deformity or instability.  The rest of the scalp and face was examined without tenderness, other wounds, other swelling, instability, or deformities.     Nose: Nose normal.     Mouth/Throat:     Mouth: Mucous membranes are moist.     Pharynx: Oropharynx is clear.  Eyes:     Conjunctiva/sclera: Conjunctivae normal.  Neck:     Musculoskeletal: Neck supple.  Cardiovascular:     Rate and Rhythm: Normal rate and regular rhythm.     Pulses: Normal pulses.          Radial pulses are 2+ on the right side and 2+ on the left side.       Posterior tibial pulses are 2+ on the right side and 2+ on the left side.     Heart sounds: Normal heart sounds.     Comments: Tactile temperature in the extremities appropriate and equal bilaterally. Pulmonary:     Effort: Pulmonary effort is normal. No respiratory distress.      Breath sounds: Normal breath sounds.  Abdominal:     Palpations: Abdomen is soft.     Tenderness: There is no abdominal tenderness. There is no guarding.  Musculoskeletal:     Right lower leg: No edema.     Left lower leg: No edema.     Comments: Tenderness to the right leg starting Melissa the right lateral hip and extending through the right knee.  There is noted swelling to the distal femur region just superior to the right knee. No deformity noted to the right knee, right lower leg, or right ankle.  Normal motor function intact in all other extremities. No midline spinal tenderness.   Lymphadenopathy:     Cervical: No cervical adenopathy.  Skin:    General: Skin is warm and dry.  Neurological:     Mental Status: She is alert.     Comments: Oriented to self only. Sensation to light touch grossly intact in each of the extremities and equal bilaterally. Grip strength equal bilaterally. No facial droop. Motor function intact in each of the extremities.  Psychiatric:        Mood and Affect: Mood and affect normal.        Speech: Speech normal.        Behavior: Behavior normal.      ED Treatments / Results  Labs (all labs ordered are listed, but only abnormal results are displayed) Labs Reviewed  COMPREHENSIVE METABOLIC PANEL - Abnormal; Notable for the following components:      Result Value   Glucose, Bld 130 (*)    BUN 35 (*)    Creatinine, Ser 1.57 (*)    GFR calc non Af Amer 29 (*)    GFR calc Af Amer 34 (*)    All other components within normal limits  CBC WITH DIFFERENTIAL/PLATELET - Abnormal; Notable for the following components:   RBC 2.99 (*)    Hemoglobin 9.6 (*)    HCT 30.3 (*)    MCV 101.3 (*)    All other components within normal limits  URINALYSIS, ROUTINE W REFLEX MICROSCOPIC - Abnormal; Notable for the following components:   Leukocytes,Ua TRACE (*)    All other components within normal limits  SARS CORONAVIRUS 2 (HOSPITAL ORDER, [facility])  URINE CULTURE    EKG EKG Interpretation  Date/Time:  Monday May 14 2019 16:26:12 EDT Ventricular Rate:  74 PR Interval:    QRS Duration: 91 QT Interval:  449 QTC Calculation: 499 R Axis:   41 Text Interpretation:  Sinus rhythm Borderline prolonged QT interval Baseline wander in lead(s) V3 V4 V5 V6 Confirmed by Virgel Manifold [phone]) on 05/14/2019 11:45:58 PM   Radiology Dg Pelvis 1-2 Views  Result Date: 05/14/2019 CLINICAL DATA:  Distal RIGHT femoral and RIGHT knee pain EXAM: PELVIS - 1-2 VIEW COMPARISON:  None FINDINGS: Marked osseous demineralization. Hip and SI joint spaces preserved. No fracture, dislocation, or bone destruction. Degenerative disc disease changes Melissa visualized lumbar spine. Extensive atherosclerotic calcifications and scattered phleboliths. IMPRESSION: Marked osseous demineralization and lower lumbar degenerative changes without acute bony abnormalities. Electronically Signed   By: Lavonia Dana M.D.   On: 05/14/2019 19:06   Ct Head Wo Contrast  Result Date: 05/14/2019 CLINICAL DATA:  Fall.  Head injury EXAM: CT HEAD WITHOUT CONTRAST CT CERVICAL SPINE WITHOUT CONTRAST TECHNIQUE: Multidetector CT imaging of the head and cervical spine was performed following the standard protocol without intravenous contrast. Multiplanar CT image reconstructions of the cervical spine were also generated. COMPARISON:  None. FINDINGS: CT HEAD FINDINGS Brain: Generalized atrophy. Extensive white matter disease bilaterally which is symmetric and appears chronic. Negative for acute infarct, hemorrhage, mass. No fluid collection or midline shift. Vascular: Negative for hyperdense vessel. Atherosclerotic calcification in the cavernous carotid bilaterally. Skull: Negative for fracture Sinuses/Orbits: Mild sinus mucosal disease. Bilateral cataract surgery Other: None CT CERVICAL SPINE FINDINGS Alignment: Mild anterolisthesis C3-4 and C4-5  and C7-T1 Skull base and vertebrae: Negative  for fracture Soft tissues and spinal canal: Carotid artery atherosclerotic disease. Negative for mass or adenopathy. Disc levels: Multilevel disc and facet degeneration throughout the cervical spine and upper thoracic spine. Extensive spurring. Foraminal encroachment bilaterally Melissa C3-4, C4-5, C5-6, C6-7. Upper chest: Lung apices clear bilaterally Other: None IMPRESSION: 1. No acute intracranial abnormality. Atrophy and extensive white matter disease most likely chronic microvascular ischemia 2. Advanced cervical spondylosis.  Negative for fracture Electronically Signed   By: Franchot Gallo M.D.   On: 05/14/2019 19:01   Ct Cervical Spine Wo Contrast  Result Date: 05/14/2019 CLINICAL DATA:  Fall.  Head injury EXAM: CT HEAD WITHOUT CONTRAST CT CERVICAL SPINE WITHOUT CONTRAST TECHNIQUE: Multidetector CT imaging of the head and cervical spine was performed following the standard protocol without intravenous contrast. Multiplanar CT image reconstructions of the cervical spine were also generated. COMPARISON:  None. FINDINGS: CT HEAD FINDINGS Brain: Generalized atrophy. Extensive white matter disease bilaterally which is symmetric and appears chronic. Negative for acute infarct, hemorrhage, mass. No fluid collection or midline shift. Vascular: Negative for hyperdense vessel. Atherosclerotic calcification in the cavernous carotid bilaterally. Skull: Negative for fracture Sinuses/Orbits: Mild sinus mucosal disease. Bilateral cataract surgery Other: None CT CERVICAL SPINE FINDINGS Alignment: Mild anterolisthesis C3-4 and C4-5 and C7-T1 Skull base and vertebrae: Negative for fracture Soft tissues and spinal canal: Carotid artery atherosclerotic disease. Negative for mass or adenopathy. Disc levels: Multilevel disc and facet degeneration throughout the cervical spine and upper thoracic spine. Extensive spurring. Foraminal encroachment bilaterally Melissa C3-4, C4-5, C5-6, C6-7. Upper chest: Lung apices clear bilaterally Other:  None IMPRESSION: 1. No acute intracranial abnormality. Atrophy and extensive white matter disease most likely chronic microvascular ischemia 2. Advanced cervical spondylosis.  Negative for fracture Electronically Signed   By: Franchot Gallo M.D.   On: 05/14/2019 19:01   Ct Knee Right Wo Contrast  Result Date: 05/14/2019 CLINICAL DATA:  Fall today. Per x-ray report, displaced fracture of lateral femoral condyle. EXAM: CT OF THE RIGHT KNEE WITHOUT CONTRAST TECHNIQUE: Multidetector CT imaging of the RIGHT knee was performed according to the standard protocol. Multiplanar CT image reconstructions were also generated. COMPARISON:  None. FINDINGS: Bones/Joint/Cartilage A displaced fracture is confirmed within the lateral femoral condyle, comminuted with extension to the posterior and inferior cortical margins (best seen on sagittal series 6, image 36) and extending centrally and anteriorly to the midline anterior cortex where there is approximately 6 mm fracture diastasis (coronal series 5, image 49). No fracture extension to the medial femoral condyle. Tibial plateau is intact. Proximal fibula is intact. Associated joint effusion with lipohemarthrosis level in the suprapatellar bursa. Questionable nondisplaced fractures within the medial and lateral portion of the patella, but not convincing. IMPRESSION: 1. Displaced/comminuted fracture within the lateral femoral condyle, with extension to the posterior and inferior cortex, and extending centrally and inferiorly to the midline femur where there is approximately 6 mm fracture diastasis Melissa the articular surface. 2. Associated joint effusion with lipohemarthrosis level in the suprapatellar bursa. 3. Questionable nondisplaced fracture within the medial and lateral aspects of the patella, but not convincing, more likely prominent neutral vessel foramina. Electronically Signed   By: Franki Cabot M.D.   On: 05/14/2019 20:33   Dg Femur Min 2 Views Right  Result Date:  05/14/2019 CLINICAL DATA:  Acute pain and swelling. Status post fall. EXAM: RIGHT FEMUR 2 VIEWS COMPARISON:  None. FINDINGS: Slightly displaced fracture of the lateral femoral condyle, without convincing extension to  the underlying articular surface. Medial femoral condyle appears intact. Proximal tibia and fibula appear intact and normally aligned. Proximal femur appears intact and normally aligned. Prominent joint effusion within the suprapatellar bursa. Extensive vascular calcifications throughout the RIGHT lower extremity. IMPRESSION: 1. Displaced fracture of the lateral femoral condyle, without convincing extension to the underlying articular surface. 2. Prominent joint effusion within the suprapatellar bursa. 3. Extensive vascular calcifications. Electronically Signed   By: Franki Cabot M.D.   On: 05/14/2019 18:59    Procedures .Marland KitchenLaceration Repair  Date/Time: 05/14/2019 10:00 PM Performed by: Lorayne Bender, PA-C Authorized by: Lorayne Bender, PA-C   Consent:    Consent obtained:  Verbal   Consent given by:  Melissa   Risks discussed:  Infection, need for additional repair, pain, poor cosmetic result and poor wound healing Anesthesia (see MAR for exact dosages):    Anesthesia method:  Local infiltration   Local anesthetic:  Lidocaine 2% WITH epi Laceration details:    Location:  Scalp   Scalp location:  R temporal   Length (cm):  1.5 Repair type:    Repair type:  Simple Pre-procedure details:    Preparation:  Melissa was prepped and draped in usual sterile fashion Treatment:    Area cleansed with:  Saline and Hibiclens   Amount of cleaning:  Standard   Irrigation solution:  Sterile saline   Irrigation method:  Syringe Skin repair:    Repair method:  Sutures   Suture size:  4-0   Wound skin closure material used: Vicryl Rapide.   Suture technique:  Simple interrupted   Number of sutures:  3 Approximation:    Approximation:  Close Post-procedure details:    Dressing:  Open (no  dressing)   Melissa tolerance of procedure:  Tolerated well, no immediate complications   (including critical care time)  Medications Ordered in ED Medications  mirtazapine (REMERON) tablet 15 mg (15 mg Oral Given 05/14/19 2232)  divalproex (DEPAKOTE) DR tablet 125 mg (125 mg Oral Given 05/14/19 2307)  ondansetron (ZOFRAN) injection 4 mg (4 mg Intravenous Given 05/14/19 1720)  morphine 2 MG/ML injection 2 mg (2 mg Intravenous Given 05/14/19 1720)  morphine 4 MG/ML injection 4 mg (4 mg Intravenous Given 05/14/19 1756)  lidocaine-EPINEPHrine (XYLOCAINE W/EPI) 2 %-1:200000 (PF) injection 10 mL (10 mLs Infiltration Given 05/14/19 2028)  Tdap (BOOSTRIX) injection 0.5 mL (0.5 mLs Intramuscular Given 05/14/19 2025)  methadone (DOLOPHINE) tablet 10 mg (10 mg Oral Given 05/14/19 2102)  HYDROmorphone (DILAUDID) injection 0.5 mg (0.5 mg Intravenous Given 05/14/19 2102)  methadone (DOLOPHINE) tablet 30 mg (30 mg Oral Given 05/14/19 2232)     Initial Impression / Assessment and Plan / ED Course  I have reviewed the triage vital signs and the nursing notes.  Pertinent labs & imaging results that were available during my care of the Melissa were reviewed by me and considered in my medical decision making (see chart for details).  Clinical Course as of May 13 2345  Mon May 14, 2019  1730 No previous values with which to compare.  Hemoglobin(!): 9.6 [SJ]  1818 Spoke with Barbaraann Hill, Melissa Hill, Melissa the bedside.  Contact number: [phone] She provided an information sheet with Melissa history.  I added this information to the Melissa chart. She confirms Melissa has never been seen Melissa a local facility and that Melissa was recently moved to the area from [place].   [SJ]  4196 No previous with which to compare, however, in Melissa medical history  that came with her from the facility she has CKD stage III listed, implying eGFR of 30-59.  GFR, Est Non African American(!): 29 [SJ]  1930 Spoke  with Dr. Aline Brochure, orthopedic surgeon.  Requests CT of the knee to better characterize injury.  Admit via medicine service.   [SJ]  2059 Based on the CT results, Dr. Aline Brochure recommends no surgical intervention.  Knee immobilizer and nonweightbearing.   [SJ]  2146 Spoke with Dr. Denton Brick, hospitalist. Agrees to admit the Melissa.    [SJ]  2207 These low readings are due to Melissa moving around in the bed.  She states she is agitated due to pain in her right leg.  There is poor waveform.  She shows no indication of shortness of breath or increased work of breathing.  SpO2(!): 76 % [SJ]    Clinical Course User Index [SJ] Samiyah Stupka C, PA-C       Melissa presents for evaluation following a fall.   Comminuted distal femur fracture confirmed on CT. No evidence of neurovascular compromise.  We had difficulty adequately controlling the Melissa pain, even with IV narcotics.  For this reason, we admitted the Melissa for pain management.  Findings and plan of care discussed with Virgel Manifold, MD.   Final Clinical Impressions(s) / ED Diagnoses   Final diagnoses:  Fall, initial encounter  Closed comminuted intra-articular fracture of distal end of right femur, initial encounter [facility])    ED Discharge Orders    None       Layla Maw 05/14/19 2346    Virgel Manifold, MD 05/15/19 1704

## 2019-05-15 DIAGNOSIS — S72421A Displaced fracture of lateral condyle of right femur, initial encounter for closed fracture: Secondary | ICD-10-CM | POA: Diagnosis not present

## 2019-05-15 DIAGNOSIS — S0101XA Laceration without foreign body of scalp, initial encounter: Secondary | ICD-10-CM | POA: Diagnosis not present

## 2019-05-15 LAB — URINE CULTURE: Culture: <10000 — AB

## 2019-05-15 LAB — BASIC METABOLIC PANEL
Anion gap: 10 (ref 5–15)
BUN: 29 mg/dL — ABNORMAL HIGH (ref 8–23)
CO2: 32 mmol/L (ref 22–32)
Calcium: 8.9 mg/dL (ref 8.9–10.3)
Chloride: 98 mmol/L (ref 98–111)
Creatinine, Ser: 1.42 mg/dL — ABNORMAL HIGH (ref 0.44–1.00)
GFR calc Af Amer: 38 mL/min — ABNORMAL LOW (ref >60)
GFR calc non Af Amer: 33 mL/min — ABNORMAL LOW (ref >60)
Glucose, Bld: 125 mg/dL — ABNORMAL HIGH (ref 70–99)
Potassium: 3.6 mmol/L (ref 3.5–5.1)
Sodium: 140 mmol/L (ref 135–145)

## 2019-05-15 MED ORDER — POLYETHYLENE GLYCOL 3350 17 G PO PACK: 17.0000 g | PACK | Freq: Every day | ORAL | Status: DC | PRN

## 2019-05-15 MED ORDER — LEVOTHYROXINE SODIUM 100 MCG PO TABS
100.0000 ug | ORAL_TABLET | Freq: Every day | ORAL | Status: DC
  Administered 2019-05-15 – 2019-05-16 (×2): 100 ug via ORAL
  Filled 2019-05-15 (×2): qty 1

## 2019-05-15 MED ORDER — GALANTAMINE HYDROBROMIDE 4 MG PO TABS
12.0000 mg | ORAL_TABLET | Freq: Every morning | ORAL | Status: DC
  Administered 2019-05-15 – 2019-05-16 (×2): 12 mg via ORAL
  Filled 2019-05-15 (×4): qty 3

## 2019-05-15 MED ORDER — METHADONE HCL 10 MG PO TABS
40.0000 mg | ORAL_TABLET | Freq: Two times a day (BID) | ORAL | Status: DC
  Administered 2019-05-15 – 2019-05-16 (×4): 40 mg via ORAL
  Filled 2019-05-15 (×4): qty 4

## 2019-05-15 MED ORDER — HYDROMORPHONE HCL 1 MG/ML IJ SOLN
0.5000 mg | INTRAMUSCULAR | Status: DC | PRN
  Administered 2019-05-15 – 2019-05-16 (×6): 0.5 mg via INTRAVENOUS
  Filled 2019-05-15 (×7): qty 0.5

## 2019-05-15 MED ORDER — ACETAMINOPHEN 650 MG RE SUPP: 650.0000 mg | Freq: Four times a day (QID) | RECTAL | Status: DC | PRN

## 2019-05-15 MED ORDER — PANTOPRAZOLE SODIUM 40 MG PO TBEC
40.0000 mg | DELAYED_RELEASE_TABLET | Freq: Every day | ORAL | Status: DC
  Administered 2019-05-15 – 2019-05-16 (×2): 40 mg via ORAL
  Filled 2019-05-15 (×2): qty 1

## 2019-05-15 MED ORDER — DULOXETINE HCL 60 MG PO CPEP
90.0000 mg | ORAL_CAPSULE | Freq: Every morning | ORAL | Status: DC
  Administered 2019-05-15 – 2019-05-16 (×2): 90 mg via ORAL
  Filled 2019-05-15 (×2): qty 1

## 2019-05-15 MED ORDER — ACETAMINOPHEN 325 MG PO TABS
650.0000 mg | ORAL_TABLET | Freq: Four times a day (QID) | ORAL | Status: DC | PRN
  Administered 2019-05-15 (×2): 650 mg via ORAL
  Filled 2019-05-15 (×2): qty 2

## 2019-05-15 MED ORDER — ONDANSETRON HCL 4 MG/2ML IJ SOLN: 4.0000 mg | Freq: Four times a day (QID) | INTRAMUSCULAR | Status: DC | PRN

## 2019-05-15 MED ORDER — ONDANSETRON HCL 4 MG PO TABS: 4.0000 mg | ORAL_TABLET | Freq: Four times a day (QID) | ORAL | Status: DC | PRN

## 2019-05-15 MED ORDER — TRAZODONE HCL 50 MG PO TABS
150.0000 mg | ORAL_TABLET | Freq: Every day | ORAL | Status: DC
  Administered 2019-05-15: 150 mg via ORAL
  Filled 2019-05-15: qty 3

## 2019-05-15 MED ORDER — LABETALOL HCL 5 MG/ML IV SOLN: 5.0000 mg | INTRAVENOUS | Status: DC | PRN

## 2019-05-15 MED ORDER — AMLODIPINE BESYLATE 5 MG PO TABS
10.0000 mg | ORAL_TABLET | Freq: Every morning | ORAL | Status: DC
  Administered 2019-05-15 – 2019-05-16 (×2): 10 mg via ORAL
  Filled 2019-05-15 (×2): qty 2

## 2019-05-15 MED ORDER — LACTATED RINGERS IV SOLN: INTRAVENOUS | Status: AC

## 2019-05-15 NOTE — TOC Initial Note (Signed)
Transition of Care [facility]) - Initial/Assessment Note    Patient Details  Name: Melissa Hill MRN: [id number] Date of Birth: 26-Dec-1929  [facility] CM/SW Contact:    Trish Mage, LCSW Phone Number: 05/15/2019, 3:48 PM  Clinical Narrative:   Ms fear is an 85 YO Caucasian female admitted for pain management status post fall with fractured leg.  Her daughter, Ms Melissa [place],  is at bed side and gives me much of the information with patient permission. Ms Melissa [place] [phone] and her sister, both of whom reside in [facility], brought their mother from [place] to [place] to reside about a year ago so that she could be closer to them.  She uses a walker or cane  for ambulation and is able to do ADL's at baseline.  Goal is for her to return to [facility] after her stay in rehab. Daughter selected [facility] first, then [facility] and [facility].             Expected Discharge Plan: [place]     Patient Goals and CMS Choice Patient states their goals for this hospitalization and ongoing recovery are:: "I'm not feeling so well today." CMS Medicare.gov Compare Post Acute Care list provided to:: Patient Represenative (must comment)(Daughter Ms Melissa Hill, Healthcare POA) Choice offered to / list presented to : [facility] Communities POA / Guardian  Expected Discharge Plan and Services Expected Discharge Plan: [place]   Discharge Planning Services: CM Consult Post Acute Care Choice: [place] Living arrangements for the past 2 months: [place] Expected Discharge Date: 05/16/19                                    Prior Living Arrangements/Services Living arrangements for the past 2 months: [place] Lives with:: Facility Resident Patient language and need for interpreter reviewed:: Yes Do you feel safe going back to the place where you live?: No   Too much pain, unable to ambulate  Need for Family Participation in  Patient Care: Yes (Comment) Care giver support system in place?: Yes (comment) Current home services: DME Criminal Activity/Legal Involvement Pertinent to Current Situation/Hospitalization: No - Comment as needed  Activities of Daily Living Home Assistive Devices/Equipment: None ADL Screening (condition at time of admission) Patient's cognitive ability adequate to safely complete daily activities?: No Is the patient deaf or have difficulty hearing?: No Does the patient have difficulty seeing, even when wearing glasses/contacts?: No Does the patient have difficulty concentrating, remembering, or making decisions?: Yes Patient able to express need for assistance with ADLs?: Yes Does the patient have difficulty dressing or bathing?: Yes Independently performs ADLs?: No Communication: Needs assistance Is this a change from baseline?: Pre-admission baseline Dressing (OT): Needs assistance Is this a change from baseline?: Pre-admission baseline Grooming: Needs assistance Is this a change from baseline?: Pre-admission baseline Feeding: Needs assistance Is this a change from baseline?: Pre-admission baseline Bathing: Needs assistance Is this a change from baseline?: Pre-admission baseline Toileting: Needs assistance Is this a change from baseline?: Pre-admission baseline In/Out Bed: Needs assistance Is this a change from baseline?: Pre-admission baseline Walks in Home: Needs assistance Is this a change from baseline?: Pre-admission baseline Does the patient have difficulty walking or climbing stairs?: Yes Weakness of Legs: Right Weakness of Arms/Hands: None  Permission Sought/Granted Permission sought to share information with : Family Supports    Share Information with  NAME: Ms Melissa [place]     Permission granted to share info w Relationship: daughter     Emotional Assessment Appearance:: Appears stated age Attitude/Demeanor/Rapport: Engaged Affect (typically observed):  Blunt Orientation: : Oriented to Self, Oriented to Place Alcohol / Substance Use: Not Applicable Psych Involvement: No (comment)  Admission diagnosis:  Fall [W19.XXXA] Fall, initial encounter [id number][W19.XXXA] Closed comminuted intra-articular fracture of distal end of right femur, initial encounter [facility](HCC) [S72.491A] Patient Active Problem List   Diagnosis Date Noted  . Displaced fracture of lateral condyle of right femur, initial encounter for closed fracture (HCC) 05/14/2019  . Fall    PCP:  Bryson CoronaPierce, Patricia A, NP Pharmacy:  No Pharmacies Listed    Social Determinants of Health (SDOH) Interventions    Readmission Risk Interventions No flowsheet data found.

## 2019-05-15 NOTE — Consult Note (Signed)
Patient ID: Melissa [place], female   DOB: 06-Aug-1930, 85 y.o.   MRN: [id number]  No chief complaint on file.   HPI Melissa Hill is a 85 y.o. female.   Review of Systems (at least 2) ROS     History reviewed. No pertinent surgical history.   Social History   Tobacco Use  . Smoking status: Former Research scientist (life sciences)  . Smokeless tobacco: Never Used  Substance Use Topics  . Alcohol use: Not Currently  . Drug use: Not Currently          Physical Exam-Detailed Physical Exam  Blood pressure (!) 184/91, pulse 84, temperature 98.3 F (36.8 C), temperature source Oral, resp. rate 16, height 5\' 3"  (1.6 m), weight 45.4 kg, SpO2 96 %. 05/15/2019, 7:34 AM   Carole Civil MD

## 2019-05-15 NOTE — Care Management Obs Status (Signed)
[place] NOTIFICATION   Patient Details  Name: DARNICE COMRIE MRN: [id number] Date of Birth: [birth year]   Medicare Observation Status Notification Given:  Yes    Trish Mage, LCSW 05/15/2019, 1:44 PM

## 2019-05-15 NOTE — Plan of Care (Signed)
  Problem: Acute Rehab PT Goals(only PT should resolve) Goal: Pt Will Go Supine/Side To Sit Outcome: Progressing Flowsheets (Taken 05/15/2019 1431) Pt will go Supine/Side to Sit: with minimal assist Goal: Patient Will Transfer Sit To/From Stand Outcome: Progressing Flowsheets (Taken 05/15/2019 1431) Patient will transfer sit to/from stand: with minimal assist Goal: Pt Will Transfer Bed To Chair/Chair To Bed Outcome: Progressing Flowsheets (Taken 05/15/2019 1431) Pt will Transfer Bed to Chair/Chair to Bed:  with min assist  with mod assist Goal: Pt Will Ambulate Outcome: Progressing Flowsheets (Taken 05/15/2019 1431) Pt will Ambulate:  15 feet  with moderate assist  with rolling walker Note: With NWB RLE and immobilizer right knee   2:33 PM, 05/15/19 Lonell Grandchild, MPT Physical Therapist with [facility] [phone] office [phone] mobile phone

## 2019-05-15 NOTE — Progress Notes (Signed)
PROGRESS NOTE    Melissa Hill  ZOX:[id number] DOB: [birth year] DOA: 05/14/2019 PCP: Bryson CoronaPierce, Patricia A, NP   Brief Narrative:  Per HPI: Melissa Hill is a 85 y.o. female with medical history significant for dementia, CKD 3, hypothyroidism, hypertension, depression, opioid dependency who was brought to the ED via EMS reports of a fall, with a laceration to the right side of her face.   My evaluation, patient is awake and alert, verbalizing and answers simple questions appropriately.  Patient complains of pain to the right side of her face and right leg pain.  She does not remember the events surrounding the fall. But she denies difficulty breathing, no chest pain, no shortness of breath, no vomiting no loose stools. Called nursing facility to get further history but got the voicemail box.  Patient was admitted with a right femur fracture likely secondary to mechanical fall, however details cannot be adequately obtained.  She does not appear to be a surgical candidate.  She is also noted to have opioid dependency as well as AKI on CKD stage III.  She has improving AKI after lisinopril and HCTZ has been held and she was maintained on some gentle IV fluid.  Assessment & Plan:   Active Problems:   Displaced fracture of lateral condyle of right femur, initial encounter for closed fracture (HCC)   Right femur fracture-sustained from likely mechanical fall. EDP talked to Dr. Romeo AppleHarrison.  Recommended for further evaluation-displaced/comminuted fracture within the lateral femoral condyle..(see detailed report). Patient not a surgical candidate.  Unable to get further details from nursing home. -Pain control, resume home methadone, PRN Dilaudid 0.5mg ; currently somnolent -Nonweightbearing -Knee immobilizer -Seen by ortho and PT  Probable acute kidney injury versus CKD 3- Cr 1.57.  No baseline to compare in epic or care everywhere. -Hold home HCTZ, lisinopril - LR 75cc/hr to be continued -BMP  a.m.  Opioid dependency-home meds methadone 40 mg twice daily.  With high opiate tolerance. -Resume home methadone  Hypertension-elevated likely secondary to pain. -PRN labetalol 5mg  -Resume home Norvasc -Hold home HCTZ and lisinopril  Hypothyroidism-  -Resume home Synthroid  Dementia, depression - Resume home nightly trazodone, mirtazapine, Cymbalta, Depakote, galantamine   DVT prophylaxis: SCDs Code Status: Full Family Communication: Daughter at bedside Disposition Plan: Per orthopedics and PT evaluation. No surgery planned. Will need to go to Rehab once bed available, hopefully in next 24 hours.   Consultants:   Orthopedics  Procedures:   None  Antimicrobials:   None   Subjective: Patient seen and evaluated today and appears somewhat somnolent.  Cannot obtain any meaningful history given dementia.  No acute concerns or events noted overnight.  Objective: Vitals:   05/14/19 2306 05/14/19 2342 05/15/19 0511 05/15/19 1422  BP: (!) 189/81 (!) 195/91 (!) 184/91 (!) 168/88  Pulse: 75 69 84 87  Resp:  16 16 16   Temp:  98.1 F (36.7 C) 98.3 F (36.8 C) 98 F (36.7 C)  TempSrc:  Oral Oral   SpO2: 94% 97% 96% 96%  Weight:      Height:        Intake/Output Summary (Last 24 hours) at 05/15/2019 1425 Last data filed at 05/15/2019 1000 Gross per 24 hour  Intake 180 ml  Output 400 ml  Net -220 ml   Filed Weights   05/14/19 1652  Weight: 45.4 kg    Examination:  General exam: Appears calm and comfortable, somnolent Respiratory system: Clear to auscultation. Respiratory effort normal. Cardiovascular system: S1 & S2  heard, RRR. No JVD, murmurs, rubs, gallops or clicks. No pedal edema. Gastrointestinal system: Abdomen is nondistended, soft and nontender. No organomegaly or masses felt. Normal bowel sounds heard. Central nervous system: Somnolent but arousable. Extremities: Symmetric 5 x 5 power. R knee in brace. Skin: No rashes, lesions or  ulcers Psychiatry: Cannot be assessed given patient condition.    Data Reviewed: I have personally reviewed following labs and imaging studies  CBC: Recent Labs  Lab 05/14/19 1706  WBC 5.8  NEUTROABS 4.2  HGB 9.6*  HCT 30.3*  MCV 101.3*  PLT 238   Basic Metabolic Panel: Recent Labs  Lab 05/14/19 1706 05/15/19 0617  NA 142 140  K 4.7 3.6  CL 101 98  CO2 32 32  GLUCOSE 130* 125*  BUN 35* 29*  CREATININE 1.57* 1.42*  CALCIUM 8.9 8.9   GFR: Estimated Creatinine Clearance: 20 mL/min (A) (by C-G formula based on SCr of 1.42 mg/dL (H)). Liver Function Tests: Recent Labs  Lab 05/14/19 1706  AST 16  ALT 11  ALKPHOS 60  BILITOT 0.3  PROT 6.5  ALBUMIN 3.5   No results for input(s): LIPASE, AMYLASE in the last 168 hours. No results for input(s): AMMONIA in the last 168 hours. Coagulation Profile: No results for input(s): INR, PROTIME in the last 168 hours. Cardiac Enzymes: No results for input(s): CKTOTAL, CKMB, CKMBINDEX, TROPONINI in the last 168 hours. BNP (last 3 results) No results for input(s): PROBNP in the last 8760 hours. HbA1C: No results for input(s): HGBA1C in the last 72 hours. CBG: No results for input(s): GLUCAP in the last 168 hours. Lipid Profile: No results for input(s): CHOL, HDL, LDLCALC, TRIG, CHOLHDL, LDLDIRECT in the last 72 hours. Thyroid Function Tests: No results for input(s): TSH, T4TOTAL, FREET4, T3FREE, THYROIDAB in the last 72 hours. Anemia Panel: No results for input(s): VITAMINB12, FOLATE, FERRITIN, TIBC, IRON, RETICCTPCT in the last 72 hours. Sepsis Labs: No results for input(s): PROCALCITON, LATICACIDVEN in the last 168 hours.  Recent Results (from the past 240 hour(s))  SARS Coronavirus 2 (CEPHEID - Performed in [facility] lab), Hosp Order     Status: None   Collection Time: 05/14/19  8:25 PM   Specimen: Nasopharyngeal Swab  Result Value Ref Range Status   SARS Coronavirus 2 NEGATIVE NEGATIVE Final    Comment:  (NOTE) If result is NEGATIVE SARS-CoV-2 target nucleic acids are NOT DETECTED. The SARS-CoV-2 RNA is generally detectable in upper and lower  respiratory specimens during the acute phase of infection. The lowest  concentration of SARS-CoV-2 viral copies this assay can detect is 250  copies / mL. A negative result does not preclude SARS-CoV-2 infection  and should not be used as the sole basis for treatment or other  patient management decisions.  A negative result may occur with  improper specimen collection / handling, submission of specimen other  than nasopharyngeal swab, presence of viral mutation(s) within the  areas targeted by this assay, and inadequate number of viral copies  (<250 copies / mL). A negative result must be combined with clinical  observations, patient history, and epidemiological information. If result is POSITIVE SARS-CoV-2 target nucleic acids are DETECTED. The SARS-CoV-2 RNA is generally detectable in upper and lower  respiratory specimens dur ing the acute phase of infection.  Positive  results are indicative of active infection with SARS-CoV-2.  Clinical  correlation with patient history and other diagnostic information is  necessary to determine patient infection status.  Positive results do  not rule out bacterial infection or co-infection with other viruses. If result is PRESUMPTIVE POSTIVE SARS-CoV-2 nucleic acids MAY BE PRESENT.   A presumptive positive result was obtained on the submitted specimen  and confirmed on repeat testing.  While 2019 novel coronavirus  (SARS-CoV-2) nucleic acids may be present in the submitted sample  additional confirmatory testing may be necessary for epidemiological  and / or clinical management purposes  to differentiate between  SARS-CoV-2 and other Sarbecovirus currently known to infect humans.  If clinically indicated additional testing with an alternate test  methodology [phone] is advised. The SARS-CoV-2 RNA is  generally  detectable in upper and lower respiratory sp ecimens during the acute  phase of infection. The expected result is Negative. Fact Sheet for Patients:  BoilerBrush.com.cyhttps://www.fda.gov/media/136312/download Fact Sheet for Healthcare Providers: https://pope.com/https://www.fda.gov/media/136313/download This test is not yet approved or cleared by the [place] FDA and has been authorized for detection and/or diagnosis of SARS-CoV-2 by FDA under an Emergency Use Authorization (EUA).  This EUA will remain in effect (meaning this test can be used) for the duration of the COVID-19 declaration under Section 564(b)(1) of the Act, 21 U.S.C. section 360bbb-3(b)(1), unless the authorization is terminated or revoked sooner. Performed at [facility], [address] [place], [place] [address]          Radiology Studies: Dg Pelvis 1-2 Views  Result Date: 05/14/2019 CLINICAL DATA:  Distal RIGHT femoral and RIGHT knee pain EXAM: PELVIS - 1-2 VIEW COMPARISON:  None FINDINGS: Marked osseous demineralization. Hip and SI joint spaces preserved. No fracture, dislocation, or bone destruction. Degenerative disc disease changes at visualized lumbar spine. Extensive atherosclerotic calcifications and scattered phleboliths. IMPRESSION: Marked osseous demineralization and lower lumbar degenerative changes without acute bony abnormalities. Electronically Signed   By: Ulyses SouthwardMark  Boles M.D.   On: 05/14/2019 19:06   Ct Head Wo Contrast  Result Date: 05/14/2019 CLINICAL DATA:  Fall.  Head injury EXAM: CT HEAD WITHOUT CONTRAST CT CERVICAL SPINE WITHOUT CONTRAST TECHNIQUE: Multidetector CT imaging of the head and cervical spine was performed following the standard protocol without intravenous contrast. Multiplanar CT image reconstructions of the cervical spine were also generated. COMPARISON:  None. FINDINGS: CT HEAD FINDINGS Brain: Generalized atrophy. Extensive white matter disease bilaterally which is symmetric and appears chronic.  Negative for acute infarct, hemorrhage, mass. No fluid collection or midline shift. Vascular: Negative for hyperdense vessel. Atherosclerotic calcification in the cavernous carotid bilaterally. Skull: Negative for fracture Sinuses/Orbits: Mild sinus mucosal disease. Bilateral cataract surgery Other: None CT CERVICAL SPINE FINDINGS Alignment: Mild anterolisthesis C3-4 and C4-5 and C7-T1 Skull base and vertebrae: Negative for fracture Soft tissues and spinal canal: Carotid artery atherosclerotic disease. Negative for mass or adenopathy. Disc levels: Multilevel disc and facet degeneration throughout the cervical spine and upper thoracic spine. Extensive spurring. Foraminal encroachment bilaterally at C3-4, C4-5, C5-6, C6-7. Upper chest: Lung apices clear bilaterally Other: None IMPRESSION: 1. No acute intracranial abnormality. Atrophy and extensive white matter disease most likely chronic microvascular ischemia 2. Advanced cervical spondylosis.  Negative for fracture Electronically Signed   By: Marlan Palauharles  Clark M.D.   On: 05/14/2019 19:01   Ct Cervical Spine Wo Contrast  Result Date: 05/14/2019 CLINICAL DATA:  Fall.  Head injury EXAM: CT HEAD WITHOUT CONTRAST CT CERVICAL SPINE WITHOUT CONTRAST TECHNIQUE: Multidetector CT imaging of the head and cervical spine was performed following the standard protocol without intravenous contrast. Multiplanar CT image reconstructions of the cervical spine were also generated. COMPARISON:  None. FINDINGS: CT HEAD FINDINGS  Brain: Generalized atrophy. Extensive white matter disease bilaterally which is symmetric and appears chronic. Negative for acute infarct, hemorrhage, mass. No fluid collection or midline shift. Vascular: Negative for hyperdense vessel. Atherosclerotic calcification in the cavernous carotid bilaterally. Skull: Negative for fracture Sinuses/Orbits: Mild sinus mucosal disease. Bilateral cataract surgery Other: None CT CERVICAL SPINE FINDINGS Alignment: Mild  anterolisthesis C3-4 and C4-5 and C7-T1 Skull base and vertebrae: Negative for fracture Soft tissues and spinal canal: Carotid artery atherosclerotic disease. Negative for mass or adenopathy. Disc levels: Multilevel disc and facet degeneration throughout the cervical spine and upper thoracic spine. Extensive spurring. Foraminal encroachment bilaterally at C3-4, C4-5, C5-6, C6-7. Upper chest: Lung apices clear bilaterally Other: None IMPRESSION: 1. No acute intracranial abnormality. Atrophy and extensive white matter disease most likely chronic microvascular ischemia 2. Advanced cervical spondylosis.  Negative for fracture Electronically Signed   By: Franchot Gallo M.D.   On: 05/14/2019 19:01   Ct Knee Right Wo Contrast  Result Date: 05/14/2019 CLINICAL DATA:  Fall today. Per x-ray report, displaced fracture of lateral femoral condyle. EXAM: CT OF THE RIGHT KNEE WITHOUT CONTRAST TECHNIQUE: Multidetector CT imaging of the RIGHT knee was performed according to the standard protocol. Multiplanar CT image reconstructions were also generated. COMPARISON:  None. FINDINGS: Bones/Joint/Cartilage A displaced fracture is confirmed within the lateral femoral condyle, comminuted with extension to the posterior and inferior cortical margins (best seen on sagittal series 6, image 36) and extending centrally and anteriorly to the midline anterior cortex where there is approximately 6 mm fracture diastasis (coronal series 5, image 49). No fracture extension to the medial femoral condyle. Tibial plateau is intact. Proximal fibula is intact. Associated joint effusion with lipohemarthrosis level in the suprapatellar bursa. Questionable nondisplaced fractures within the medial and lateral portion of the patella, but not convincing. IMPRESSION: 1. Displaced/comminuted fracture within the lateral femoral condyle, with extension to the posterior and inferior cortex, and extending centrally and inferiorly to the midline femur where  there is approximately 6 mm fracture diastasis at the articular surface. 2. Associated joint effusion with lipohemarthrosis level in the suprapatellar bursa. 3. Questionable nondisplaced fracture within the medial and lateral aspects of the patella, but not convincing, more likely prominent neutral vessel foramina. Electronically Signed   By: Franki Cabot M.D.   On: 05/14/2019 20:33   Dg Femur Min 2 Views Right  Result Date: 05/14/2019 CLINICAL DATA:  Acute pain and swelling. Status post fall. EXAM: RIGHT FEMUR 2 VIEWS COMPARISON:  None. FINDINGS: Slightly displaced fracture of the lateral femoral condyle, without convincing extension to the underlying articular surface. Medial femoral condyle appears intact. Proximal tibia and fibula appear intact and normally aligned. Proximal femur appears intact and normally aligned. Prominent joint effusion within the suprapatellar bursa. Extensive vascular calcifications throughout the RIGHT lower extremity. IMPRESSION: 1. Displaced fracture of the lateral femoral condyle, without convincing extension to the underlying articular surface. 2. Prominent joint effusion within the suprapatellar bursa. 3. Extensive vascular calcifications. Electronically Signed   By: Franki Cabot M.D.   On: 05/14/2019 18:59        Scheduled Meds:  amLODipine  10 mg Oral q morning - 10a   divalproex  125 mg Oral TID   DULoxetine  90 mg Oral q morning - 10a   galantamine  12 mg Oral q morning - 10a   levothyroxine  100 mcg Oral QAC breakfast   methadone  40 mg Oral BID   mirtazapine  15 mg Oral QHS   pantoprazole  40 mg Oral Daily   traZODone  150 mg Oral QHS   Continuous Infusions:  lactated ringers       LOS: 0 days    Time spent: 30 minutes    Steed Kanaan Hoover Brunette, DO [facility] Pager [phone]  If 7PM-7AM, please contact night-coverage [email or web address] Password [email or web address] 05/15/2019, 2:25 PM

## 2019-05-15 NOTE — [place] FL2 (Signed)
[place] MEDICAID FL2 LEVEL OF CARE SCREENING TOOL     IDENTIFICATION  Patient Name: Melissa Hill Birthdate: 1930/10/06 Sex: female Admission Date (Current Location): 05/14/2019  [facility] [place]   Facility and Address:  [facility],  [address], [place] [id number]      Provider Number: [id number]  Attending Physician Name and Address:  Erick BlinksShah, Pratik D, DO  Relative Name and Phone Number:       Current Level of Care: Hospital Recommended Level of Care:   Prior Approval Number:    Date Approved/Denied:   PASRR Number:   [id number]  Discharge Plan:      Current Diagnoses: Patient Active Problem List   Diagnosis Date Noted  . Displaced fracture of lateral condyle of right femur, initial encounter for closed fracture (HCC) 05/14/2019  . Fall     Orientation RESPIRATION BLADDER Height & Weight     Self, Situation, Place  Normal Continent Weight: 45.4 kg Height:  5\' 3"  (160 cm)  BEHAVIORAL SYMPTOMS/MOOD NEUROLOGICAL BOWEL NUTRITION STATUS  (none) (none) Continent Diet(HH)  AMBULATORY STATUS COMMUNICATION OF NEEDS Skin   Extensive Assist Verbally Other (Comment)(Sutures on R side of head-open to air)                       Personal Care Assistance Level of Assistance  Bathing, Feeding, Dressing Bathing Assistance: Maximum assistance Feeding assistance: Independent Dressing Assistance: Maximum assistance     Functional Limitations Info  Sight, Hearing, Speech Sight Info: Adequate Hearing Info: Adequate Speech Info: Adequate    SPECIAL CARE FACTORS FREQUENCY  PT (By licensed PT)     PT Frequency: 5X/W              Contractures Contractures Info: Not present    Additional Factors Info  Code Status, Allergies Code Status Info: full Allergies Info: Haldol, thorazine, Talwin           Current Medications (05/15/2019):  This is the current hospital active medication list Current Facility-Administered  Medications  Medication Dose Route Frequency Provider Last Rate Last Dose  . acetaminophen (TYLENOL) tablet 650 mg  650 mg Oral Q6H PRN Emokpae, Ejiroghene E, MD   650 mg at 05/15/19 1120   Or  . acetaminophen (TYLENOL) suppository 650 mg  650 mg Rectal Q6H PRN Emokpae, Ejiroghene E, MD      . amLODipine (NORVASC) tablet 10 mg  10 mg Oral q morning - 10a Emokpae, Ejiroghene E, MD   10 mg at 05/15/19 1120  . divalproex (DEPAKOTE) DR tablet 125 mg  125 mg Oral TID Mariea ClontsEmokpae, Ejiroghene E, MD   125 mg at 05/14/19 2307  . DULoxetine (CYMBALTA) DR capsule 90 mg  90 mg Oral q morning - 10a Emokpae, Ejiroghene E, MD   90 mg at 05/15/19 1120  . galantamine (RAZADYNE) tablet 12 mg  12 mg Oral q morning - 10a Emokpae, Ejiroghene E, MD   12 mg at 05/15/19 1123  . HYDROmorphone (DILAUDID) injection 0.5 mg  0.5 mg Intravenous Q4H PRN Emokpae, Ejiroghene E, MD      . labetalol (NORMODYNE) injection 5 mg  5 mg Intravenous Q2H PRN Emokpae, Ejiroghene E, MD      . lactated ringers infusion   Intravenous Continuous Sherryll BurgerShah, Pratik D, DO      . levothyroxine (SYNTHROID) tablet 100 mcg  100 mcg Oral QAC breakfast Emokpae, Ejiroghene E, MD   100 mcg at 05/15/19 0625  .  methadone (DOLOPHINE) tablet 40 mg  40 mg Oral BID Emokpae, Ejiroghene E, MD   40 mg at 05/15/19 0844  . mirtazapine (REMERON) tablet 15 mg  15 mg Oral QHS Emokpae, Ejiroghene E, MD   15 mg at 05/14/19 2232  . ondansetron (ZOFRAN) tablet 4 mg  4 mg Oral Q6H PRN Emokpae, Ejiroghene E, MD       Or  . ondansetron (ZOFRAN) injection 4 mg  4 mg Intravenous Q6H PRN Emokpae, Ejiroghene E, MD      . pantoprazole (PROTONIX) EC tablet 40 mg  40 mg Oral Daily Emokpae, Ejiroghene E, MD   40 mg at 05/15/19 1120  . polyethylene glycol (MIRALAX / GLYCOLAX) packet 17 g  17 g Oral Daily PRN Emokpae, Ejiroghene E, MD      . traZODone (DESYREL) tablet 150 mg  150 mg Oral QHS Emokpae, Ejiroghene E, MD   150 mg at 05/15/19 0149     Discharge Medications: Please see  discharge summary for a list of discharge medications.  Relevant Imaging Results:  Relevant Lab Results:   Additional Information [place], [place]

## 2019-05-15 NOTE — Evaluation (Signed)
Physical Therapy Evaluation Patient Details Name: Melissa Hill MRN: [id number] DOB: 02-01-1930 Today's Date: 05/15/2019   History of Present Illness  Melissa Hill is a 85 y.o. female with medical history significant for dementia, CKD 3, hypothyroidism, hypertension, depression, opioid dependency who was brought to the ED via EMS reports of a fall, with a laceration to the right side of her face.  My evaluation, patient is awake and alert, verbalizing and answers simple questions appropriately.  Patient complains of pain to the right side of her face and right leg pain.  She does not remember the events surrounding the fall. But she denies difficulty breathing, no chest pain, no shortness of breath, no vomiting no loose stools.Called nursing facility to get further history but got the voicemail box.    Clinical Impression  Patient apprehensive due to c/o right knee pain, medicated for pain prior to therapy.  Patient unable to move RLE during bed mobility or tolerate RLE dangling while seated at bedside, required repeated verbal/tactile cueing and encouragement before patient stood and pivoted on LLE for a couple of shuffling side steps to transfer to chair.  Patient tolerated sitting up in chair after therapy - RN notified.  Patient will benefit from continued physical therapy in hospital and recommended venue below to increase strength, balance, endurance for safe ADLs and gait.    Follow Up Recommendations SNF    Equipment Recommendations  None recommended by PT    Recommendations for Other Services       Precautions / Restrictions Precautions Precautions: Fall Precaution Comments: Displaced fracture of lateral condyle of right femur, initial encounter for closed fracture Required Braces or Orthoses: Knee Immobilizer - Right Knee Immobilizer - Right: On at all times Restrictions Weight Bearing Restrictions: Yes RLE Weight Bearing: Non weight bearing      Mobility  Bed  Mobility Overal bed mobility: Needs Assistance Bed Mobility: Supine to Sit     Supine to sit: Mod assist;Max assist     General bed mobility comments: slow labored movement, unable to move RLE without assist  Transfers Overall transfer level: Needs assistance Equipment used: Rolling walker (2 wheeled) Transfers: Sit to/from [facility] Sit to Stand: Mod assist Stand pivot transfers: Mod assist       General transfer comment: increased time, required much encouragement due to pain, fair return for maintaining NWB RLE due to weakness  Ambulation/Gait Ambulation/Gait assistance: Max assist Gait Distance (Feet): 2 Feet Assistive device: Rolling walker (2 wheeled) Gait Pattern/deviations: Decreased step length - right;Decreased stance time - right;Decreased stride length Gait velocity: slow   General Gait Details: limited to 1-2 shuffling steps on LLE due to weakness and c/o severe pain RLE when standing  Stairs            Wheelchair Mobility    Modified Rankin (Stroke Patients Only)       Balance Overall balance assessment: Needs assistance Sitting-balance support: Feet supported;No upper extremity supported Sitting balance-Leahy Scale: Fair Sitting balance - Comments: had to support RLE due to patient unable to tolerate dangling due to increased pain   Standing balance support: During functional activity;Bilateral upper extremity supported Standing balance-Leahy Scale: Poor Standing balance comment: fair/poor using RW                             Pertinent Vitals/Pain Pain Assessment: Faces Faces Pain Scale: Hurts whole lot Pain Location: right knee with any movement Pain Descriptors /  Indicators: Sharp;Grimacing;Guarding Pain Intervention(s): Limited activity within patient's tolerance;Monitored during session;Premedicated before session    Home Living Family/patient expects to be discharged to:: Skilled nursing facility                       Prior Function Level of Independence: Needs assistance   Gait / Transfers Assistance Needed: Household ambulator with RW           Hand Dominance        Extremity/Trunk Assessment   Upper Extremity Assessment Upper Extremity Assessment: Generalized weakness    Lower Extremity Assessment Lower Extremity Assessment: Generalized weakness;RLE deficits/detail;LLE deficits/detail RLE Deficits / Details: grossly -3/5 except knee not tested RLE: Unable to fully assess due to immobilization RLE Sensation: WNL RLE Coordination: WNL LLE Deficits / Details: grossly 4/5    Cervical / Trunk Assessment Cervical / Trunk Assessment: Normal  Communication   Communication: No difficulties  Cognition Arousal/Alertness: Awake/alert Behavior During Therapy: WFL for tasks assessed/performed;Anxious Overall Cognitive Status: History of cognitive impairments - at baseline                                 General Comments: Patient apprehensive due to RLE pain      General Comments      Exercises     Assessment/Plan    PT Assessment Patient needs continued PT services  PT Problem List Decreased strength;Decreased activity tolerance;Decreased balance;Decreased mobility;Pain       PT Treatment Interventions Balance training;Gait training;Stair training;Functional mobility training;Therapeutic activities;Therapeutic exercise;Patient/family education    PT Goals (Current goals can be found in the Care Plan section)  Acute Rehab PT Goals Patient Stated Goal: return home PT Goal Formulation: With patient Time For Goal Achievement: 05/29/19 Potential to Achieve Goals: Good    Frequency Min 3X/week   Barriers to discharge        Co-evaluation               AM-PAC PT "6 Clicks" Mobility  Outcome Measure Help needed turning from your back to your side while in a flat bed without using bedrails?: A Lot Help needed moving from lying on your  back to sitting on the side of a flat bed without using bedrails?: A Lot Help needed moving to and from a bed to a chair (including a wheelchair)?: A Lot Help needed standing up from a chair using your arms (e.g., wheelchair or bedside chair)?: A Lot Help needed to walk in hospital room?: Total Help needed climbing 3-5 steps with a railing? : Total 6 Click Score: 10    End of Session   Activity Tolerance: Patient tolerated treatment well;Patient limited by fatigue;Patient limited by pain Patient left: in chair;with call bell/phone within reach;with chair alarm set Nurse Communication: Mobility status PT Visit Diagnosis: Unsteadiness on feet (R26.81);Other abnormalities of gait and mobility (R26.89)    Time: [id number] PT Time Calculation (min) (ACUTE ONLY): 36 min   Charges:   PT Evaluation $PT Eval Moderate Complexity: 1 Mod PT Treatments $Therapeutic Activity: 23-37 mins        2:29 PM, 05/15/19 Ocie BobJames Kalinda Romaniello, MPT Physical Therapist [facility] [phone] office [phone] mobile phone

## 2019-05-16 DIAGNOSIS — S72421A Displaced fracture of lateral condyle of right femur, initial encounter for closed fracture: Secondary | ICD-10-CM | POA: Diagnosis not present

## 2019-05-16 DIAGNOSIS — S0101XA Laceration without foreign body of scalp, initial encounter: Secondary | ICD-10-CM | POA: Diagnosis not present

## 2019-05-16 LAB — CBC
HCT: 28.6 % — ABNORMAL LOW (ref 36.0–46.0)
Hemoglobin: 8.9 g/dL — ABNORMAL LOW (ref 12.0–15.0)
MCH: 31.2 pg (ref 26.0–34.0)
MCHC: 31.1 g/dL (ref 30.0–36.0)
MCV: 100.4 fL — ABNORMAL HIGH (ref 80.0–100.0)
Platelets: 225 10*3/uL (ref 150–400)
RBC: 2.85 MIL/uL — ABNORMAL LOW (ref 3.87–5.11)
RDW: 12.9 % (ref 11.5–15.5)
WBC: 8.4 10*3/uL (ref 4.0–10.5)
nRBC: 0 % (ref 0.0–0.2)

## 2019-05-16 LAB — BASIC METABOLIC PANEL
Anion gap: 12 (ref 5–15)
BUN: 24 mg/dL — ABNORMAL HIGH (ref 8–23)
CO2: 29 mmol/L (ref 22–32)
Calcium: 8.7 mg/dL — ABNORMAL LOW (ref 8.9–10.3)
Chloride: 98 mmol/L (ref 98–111)
Creatinine, Ser: 1.25 mg/dL — ABNORMAL HIGH (ref 0.44–1.00)
GFR calc Af Amer: 45 mL/min — ABNORMAL LOW (ref >60)
GFR calc non Af Amer: 39 mL/min — ABNORMAL LOW (ref >60)
Glucose, Bld: 105 mg/dL — ABNORMAL HIGH (ref 70–99)
Potassium: 3.8 mmol/L (ref 3.5–5.1)
Sodium: 139 mmol/L (ref 135–145)

## 2019-05-16 MED ORDER — OXYCODONE HCL 5 MG PO CAPS: 5.0000 mg | ORAL_CAPSULE | ORAL | 0 refills | Status: AC | PRN

## 2019-05-16 NOTE — TOC Transition Note (Signed)
Transition of Care [facility]) - CM/SW Discharge Note   Patient Details  Name: Melissa Hill MRN: [id number] Date of Birth: [birth year]  Transition of Care [facility]) CM/SW Contact:  Boneta Lucks, RN Phone Number: 05/16/2019, 1:49 PM   Clinical Narrative:   Patient admitted with  Displaced Fracture of right femur. Place lives at [facility], [facility], [facility].  PT recommended SNF.  Refers sent to facilities choices given to SW by Daughter.  They all refused.  CM called [facility] to see if they had a contract with any HH  For patient to return and get PT at the facility.  Spoke with Juliann Pulse the Scientist, physiological, they use [facility].  She ask CM to get patient approved by [facility].  Cassie with [facility] took the referral and orders for HHPT in the facility.  Both daughter updated.  Sport and exercise psychologist. Faxed them DC summary, COVID results and PT evaluation.  Facility called back stating they have someone in the area and would pick her up in one hour.  MD and RN updated.     Final next level of care: [place]     Patient Goals and CMS Choice Patient states their goals for this hospitalization and ongoing recovery are:: to go back to ALF CMS Medicare.gov Compare Post Acute Care list provided to:: Legal Guardian([place]) Choice offered to / list presented to : [place] / [place]  Discharge Placement                       Discharge Plan and Services   Discharge Planning Services: CM Consult Post Acute Care Choice: Skilled Nursing Facility                    HH Arranged: PT [facility] Agency: [facility] Date [place]: 05/16/19 Time [place]: 0947 Representative spoke with at [place]: Cassie  Social Determinants of Health ([place]) Interventions     Readmission Risk Interventions No flowsheet data found.

## 2019-05-16 NOTE — Final Consult Note (Addendum)
Patient ID: Melissa Hill, female   DOB: [birth year], 85 y.o.   MRN: [id number] Consult in hospital [facility] Requested by Dr. Jiles Prows Reason for consultation fracture right femur  I recommend nonoperative treatment with knee immobilizer nonweightbearing based on the history and physical below  No chief complaint on file.   HPI Melissa Hill is a 85 y.o. female.  Presented to the hospital after a fall landing on her right knee.  She resides at a long-term care facility.  Pain is located over the right knee at the time of my evaluation the patient was sedate in and out of a sleeping situation with a one-day history of pain in the right knee after falling.  The pain was severe.  The patient is on methadone for long-term treatment of a previous opioid addiction but she is maintained on methadone well with no problems with the medication and no abuse of the medication.  I could not assess quality but moving and touching the right knee did cross her Dilaudid pain   Review of Systems (at least 2) ROS  1.  Swelling 2.  Her daughter is with her says she has no history of fever and that she normally walks occasionally with a cane or walker   Past Medical History:  Diagnosis Date  . CKD (chronic kidney disease), stage III ([place])   . Dementia ([place])   . GERD (gastroesophageal reflux disease)   . HTN (hypertension)   . Hypokalemia   . Hypothyroid   . Insomnia   . MDD (major depressive disorder)   . Opioid dependence ([place])   . Osteoporosis   . Reduced mobility    uses a walker for ambulation     History reviewed. No pertinent surgical history.   Social History  Social History   Tobacco Use  . Smoking status: Former Research scientist (life sciences)  . Smokeless tobacco: Never Used  Substance Use Topics  . Alcohol use: Not Currently  . Drug use: Not Currently    Allergies  Allergen Reactions  . Haldol [Haloperidol]     UNKNOWN-LISTED ON MAR  . Talwin [Pentazocine]     UNKNOWN-LISTED ON  MAR  . Thorazine [Chlorpromazine]     UNKNOWN-LISTED ON MAR    Current Facility-Administered Medications  Medication Dose Route Frequency Provider Last Rate Last Dose  . acetaminophen (TYLENOL) tablet 650 mg  650 mg Oral Q6H PRN Emokpae, Ejiroghene E, MD   650 mg at 05/15/19 1756   Or  . acetaminophen (TYLENOL) suppository 650 mg  650 mg Rectal Q6H PRN Emokpae, Ejiroghene E, MD      . amLODipine (NORVASC) tablet 10 mg  10 mg Oral q morning - 10a Emokpae, Ejiroghene E, MD   10 mg at 05/15/19 1120  . divalproex (DEPAKOTE) DR tablet 125 mg  125 mg Oral TID Emokpae, Ejiroghene E, MD   125 mg at 05/15/19 2342  . DULoxetine (CYMBALTA) DR capsule 90 mg  90 mg Oral q morning - 10a Emokpae, Ejiroghene E, MD   90 mg at 05/15/19 1120  . galantamine (RAZADYNE) tablet 12 mg  12 mg Oral q morning - 10a Emokpae, Ejiroghene E, MD   12 mg at 05/15/19 1123  . HYDROmorphone (DILAUDID) injection 0.5 mg  0.5 mg Intravenous Q4H PRN Emokpae, Ejiroghene E, MD   0.5 mg at 05/16/19 7829  . labetalol (NORMODYNE) injection 5 mg  5 mg Intravenous Q2H PRN Emokpae, Ejiroghene E, MD      . levothyroxine (SYNTHROID) tablet  100 mcg  100 mcg Oral QAC breakfast Emokpae, Ejiroghene E, MD   100 mcg at 05/16/19 [id number]  . methadone (DOLOPHINE) tablet 40 mg  40 mg Oral BID Emokpae, Ejiroghene E, MD   40 mg at 05/15/19 1757  . mirtazapine (REMERON) tablet 15 mg  15 mg Oral QHS Emokpae, Ejiroghene E, MD   15 mg at 05/14/19 2232  . ondansetron (ZOFRAN) tablet 4 mg  4 mg Oral Q6H PRN Emokpae, Ejiroghene E, MD       Or  . ondansetron (ZOFRAN) injection 4 mg  4 mg Intravenous Q6H PRN Emokpae, Ejiroghene E, MD      . pantoprazole (PROTONIX) EC tablet 40 mg  40 mg Oral Daily Emokpae, Ejiroghene E, MD   40 mg at 05/15/19 1120  . polyethylene glycol (MIRALAX / GLYCOLAX) packet 17 g  17 g Oral Daily PRN Emokpae, Ejiroghene E, MD      . traZODone (DESYREL) tablet 150 mg  150 mg Oral QHS Emokpae, Ejiroghene E, MD   150 mg at 05/15/19 0149       Physical Exam-Detailed Physical Exam  Blood pressure (!) 163/94, pulse 86, temperature 98.7 F (37.1 C), temperature source Oral, resp. rate 16, height 5\' 3"  (1.6 m), weight 45.4 kg, SpO2 93 %. Gen. appearance small frail Cardiovascular exam the pulses are 2+ with no peripheral edema  The ambulatory status is currently nonambulatory  Extremity examined left lower extremity  Inspection Range of motion assessment full range of motion as recorded Stability assessment stability test reveal no instability or laxity Muscle strength and muscle tone are normal with no atrophy or tremors Skin there are no scars rashes lesions or lacerations  Sensation deferred because of pain The patient is not oriented to person place and time The patient's mood and affect could not assess because of sedation Right lower extremity knee immobilizer was on it was removed she has a suprapatellar effusion she is tender to palpation all over the knee and reveals him pain with touching it did not range the knee muscle tone was normal skin was intact compartments were soft  MEDICAL DECISION MAKING (minimum/low)  Data Reviewed  I have personally reviewed the imaging studies and the report and my interpretation is:  Plain films show a minimally displaced lateral condyle fracture extends into the joint.  This is confirmed on CT scan.  There is question of whether or not the patella is correct as well.  Assessment and Plan  Nonoperative treatment weightbearing no weightbearing and knee immobilizer for 2 weeks  X-ray in 2 weeks apply hinged brace at 2-week visit  Delay weightbearing for 6 weeks  Fuller CanadaStanley Joua Bake 05/16/2019, 8:27 AM   Vickki HearingStanley E Kiko Ripp MD

## 2019-05-16 NOTE — Discharge Summary (Signed)
Physician Discharge Summary  ROSALIN BUSTER AOZ:[id number] DOB: 05-18-1930 DOA: 05/14/2019  PCP: Bryson Corona, NP  Admit date: 05/14/2019  Discharge date: 05/16/2019  Admitted From:Home  Disposition:  Home with home health PT  Recommendations for Outpatient Follow-up:  1. Follow up with PCP in 4 weeks 2. Follow-up with orthopedics Dr. Romeo Apple in 2 weeks for repeat imaging in hinged brace 3. Avoid weightbearing on affected side for 6 weeks 4. Continue pain medications as ordered with oxycodone for breakthrough.  Prescription for 10 tablets.  Home Health: Yes with PT  Equipment/Devices: None  Discharge Condition: Stable  CODE STATUS: Full  Diet recommendation: Heart Healthy  Brief/Interim Summary: Per HPI: Melissa Hill a 85 y.o.femalewith medical history significant fordementia, CKD 3, hypothyroidism, hypertension, depression, opioid dependency who was brought to the ED via EMS reports of a fall, with alaceration to the right side of her face.  My evaluation, patient is awake and alert, verbalizing and answers simple questions appropriately. Patient complains of pain to the right side of her face and right leg pain. She does not remember the events surrounding the fall. But shedenies difficulty breathing, no chest pain, no shortness of breath, no vomiting no loose stools. Called nursing facilityto get further history but got thevoicemail box.  Patient was admitted with a right femur fracture likely secondary to mechanical fall, however details cannot be adequately obtained.  She does not appear to be a surgical candidate.  She is also noted to have opioid dependency as well as AKI on CKD stage III.  She has improving AKI after lisinopril and HCTZ has been held and she was maintained on some gentle IV fluid.  Her AKI appears to have improved, but there is no baseline prior creatinine noted.  She will need follow-up with her PCP to ensure that her creatinine  levels are remaining stable.  She is otherwise nonoliguric and did not have any other significant electrolyte derangements.  Her creatinine on discharge is 1.25.  She will remain on her usual chronic pain meds to include methadone and will be given some oxycodone additionally for breakthrough pain.  She appears to be in stable condition and has had no other acute events during the course of this hospitalization.  She has been seen by orthopedics and noted to require follow-up in the office in 2 weeks for hinged brace and repeat imaging.  She will have home health physical therapy continue her ongoing rehabilitation.  Discharge Diagnoses:  Active Problems:   Displaced fracture of lateral condyle of right femur, initial encounter for closed fracture [facility])    Discharge Instructions  Discharge Instructions    Diet - low sodium heart healthy   Complete by: As directed    Increase activity slowly   Complete by: As directed      Allergies as of 05/16/2019      Reactions   Haldol [haloperidol]    UNKNOWN-LISTED ON MAR   Talwin [pentazocine]    UNKNOWN-LISTED ON MAR   Thorazine [chlorpromazine]    UNKNOWN-LISTED ON MAR      Medication List    TAKE these medications   acetaminophen 500 MG tablet Commonly known as: TYLENOL Take 1,000 mg by mouth every 6 (six) hours as needed for mild pain or moderate pain.   alendronate 70 MG tablet Commonly known as: FOSAMAX Take 70 mg by mouth every Monday. Take with a full glass of water on an empty stomach.   amLODipine 10 MG tablet Commonly known as:  NORVASC Take 10 mg by mouth every morning.   diclofenac sodium 1 % Gel Commonly known as: VOLTAREN Apply 2 g topically 2 (two) times daily as needed (applied to knees, lower back, and left shoulder).   divalproex 125 MG DR tablet Commonly known as: DEPAKOTE Take 125 mg by mouth 3 (three) times daily.   docusate sodium 100 MG capsule Commonly known as: COLACE Take 100 mg by mouth daily as  needed for mild constipation.   DULoxetine 30 MG capsule Commonly known as: CYMBALTA Take 90 mg by mouth every morning.   fluticasone 50 MCG/ACT nasal spray Commonly known as: FLONASE Place 1 spray into both nostrils 2 (two) times a day.   galantamine 12 MG tablet Commonly known as: RAZADYNE Take 12 mg by mouth every morning.   hydrochlorothiazide 25 MG tablet Commonly known as: HYDRODIURIL Take 25 mg by mouth every morning.   levothyroxine 100 MCG tablet Commonly known as: SYNTHROID Take 100 mcg by mouth daily before breakfast.   lidocaine 5 % Commonly known as: LIDODERM Place 1 patch onto the skin every 12 (twelve) hours. Applied to right shoulder for 12 hours on and then removed for 12 hours off   lisinopril 10 MG tablet Commonly known as: ZESTRIL Take 10 mg by mouth every morning.   loperamide 2 MG capsule Commonly known as: IMODIUM Take 2 mg by mouth See admin instructions. Take one capsule with each loose stool up to 8 doses as needed for diarrhea   methadone 10 MG tablet Commonly known as: DOLOPHINE Take 40 mg by mouth 2 (two) times a day. 8a and 6p   mirtazapine 15 MG tablet Commonly known as: REMERON Take 15 mg by mouth at bedtime.   omeprazole 20 MG capsule Commonly known as: PRILOSEC Take 20 mg by mouth every morning.   oxycodone 5 MG capsule Commonly known as: OXY-IR Take 1 capsule (5 mg total) by mouth every 4 (four) hours as needed for up to 5 days.   traZODone 150 MG tablet Commonly known as: DESYREL Take 150 mg by mouth at bedtime.   triamcinolone cream 0.1 % Commonly known as: KENALOG Apply 1 application topically 2 (two) times daily. Apply to right lower extremity rash      Follow-up Information    Merrily Brittle A, NP Follow up in 4 week(s).   Specialty: Internal Medicine Contact information: [address] [place] [address] [phone]        Carole Civil, MD Follow up in 2 week(s).   Specialties: Orthopedic Surgery,  Radiology Contact information: [address] [place] [address] [phone]          Allergies  Allergen Reactions  . Haldol [Haloperidol]     UNKNOWN-LISTED ON MAR  . Talwin [Pentazocine]     UNKNOWN-LISTED ON MAR  . Thorazine [Chlorpromazine]     UNKNOWN-LISTED ON MAR    Consultations:  Orthopedics   Procedures/Studies: Dg Pelvis 1-2 Views  Result Date: 05/14/2019 CLINICAL DATA:  Distal RIGHT femoral and RIGHT knee pain EXAM: PELVIS - 1-2 VIEW COMPARISON:  None FINDINGS: Marked osseous demineralization. Hip and SI joint spaces preserved. No fracture, dislocation, or bone destruction. Degenerative disc disease changes at visualized lumbar spine. Extensive atherosclerotic calcifications and scattered phleboliths. IMPRESSION: Marked osseous demineralization and lower lumbar degenerative changes without acute bony abnormalities. Electronically Signed   By: Lavonia Dana M.D.   On: 05/14/2019 19:06   Ct Head Wo Contrast  Result Date: 05/14/2019 CLINICAL DATA:  Fall.  Head injury EXAM: CT HEAD WITHOUT CONTRAST CT CERVICAL SPINE WITHOUT CONTRAST TECHNIQUE: Multidetector CT imaging of the head and cervical spine was performed following the standard protocol without intravenous contrast. Multiplanar CT image reconstructions of the cervical spine were also generated. COMPARISON:  None. FINDINGS: CT HEAD FINDINGS Brain: Generalized atrophy. Extensive white matter disease bilaterally which is symmetric and appears chronic. Negative for acute infarct, hemorrhage, mass. No fluid collection or midline shift. Vascular: Negative for hyperdense vessel. Atherosclerotic calcification in the cavernous carotid bilaterally. Skull: Negative for fracture Sinuses/Orbits: Mild sinus mucosal disease. Bilateral cataract surgery Other: None CT CERVICAL SPINE FINDINGS Alignment: Mild anterolisthesis C3-4 and C4-5 and C7-T1 Skull base and vertebrae: Negative for fracture Soft tissues and spinal canal:  Carotid artery atherosclerotic disease. Negative for mass or adenopathy. Disc levels: Multilevel disc and facet degeneration throughout the cervical spine and upper thoracic spine. Extensive spurring. Foraminal encroachment bilaterally at C3-4, C4-5, C5-6, C6-7. Upper chest: Lung apices clear bilaterally Other: None IMPRESSION: 1. No acute intracranial abnormality. Atrophy and extensive white matter disease most likely chronic microvascular ischemia 2. Advanced cervical spondylosis.  Negative for fracture Electronically Signed   By: Marlan Palau M.D.   On: 05/14/2019 19:01   Ct Cervical Spine Wo Contrast  Result Date: 05/14/2019 CLINICAL DATA:  Fall.  Head injury EXAM: CT HEAD WITHOUT CONTRAST CT CERVICAL SPINE WITHOUT CONTRAST TECHNIQUE: Multidetector CT imaging of the head and cervical spine was performed following the standard protocol without intravenous contrast. Multiplanar CT image reconstructions of the cervical spine were also generated. COMPARISON:  None. FINDINGS: CT HEAD FINDINGS Brain: Generalized atrophy. Extensive white matter disease bilaterally which is symmetric and appears chronic. Negative for acute infarct, hemorrhage, mass. No fluid collection or midline shift. Vascular: Negative for hyperdense vessel. Atherosclerotic calcification in the cavernous carotid bilaterally. Skull: Negative for fracture Sinuses/Orbits: Mild sinus mucosal disease. Bilateral cataract surgery Other: None CT CERVICAL SPINE FINDINGS Alignment: Mild anterolisthesis C3-4 and C4-5 and C7-T1 Skull base and vertebrae: Negative for fracture Soft tissues and spinal canal: Carotid artery atherosclerotic disease. Negative for mass or adenopathy. Disc levels: Multilevel disc and facet degeneration throughout the cervical spine and upper thoracic spine. Extensive spurring. Foraminal encroachment bilaterally at C3-4, C4-5, C5-6, C6-7. Upper chest: Lung apices clear bilaterally Other: None IMPRESSION: 1. No acute intracranial  abnormality. Atrophy and extensive white matter disease most likely chronic microvascular ischemia 2. Advanced cervical spondylosis.  Negative for fracture Electronically Signed   By: Marlan Palau M.D.   On: 05/14/2019 19:01   Ct Knee Right Wo Contrast  Result Date: 05/14/2019 CLINICAL DATA:  Fall today. Per x-ray report, displaced fracture of lateral femoral condyle. EXAM: CT OF THE RIGHT KNEE WITHOUT CONTRAST TECHNIQUE: Multidetector CT imaging of the RIGHT knee was performed according to the standard protocol. Multiplanar CT image reconstructions were also generated. COMPARISON:  None. FINDINGS: Bones/Joint/Cartilage A displaced fracture is confirmed within the lateral femoral condyle, comminuted with extension to the posterior and inferior cortical margins (best seen on sagittal series 6, image 36) and extending centrally and anteriorly to the midline anterior cortex where there is approximately 6 mm fracture diastasis (coronal series 5, image 49). No fracture extension to the medial femoral condyle. Tibial plateau is intact. Proximal fibula is intact. Associated joint effusion with lipohemarthrosis level in the suprapatellar bursa. Questionable nondisplaced fractures within the medial and lateral portion of the patella, but not convincing. IMPRESSION: 1. Displaced/comminuted fracture within the lateral femoral condyle, with extension to the posterior and inferior cortex, and  extending centrally and inferiorly to the midline femur where there is approximately 6 mm fracture diastasis at the articular surface. 2. Associated joint effusion with lipohemarthrosis level in the suprapatellar bursa. 3. Questionable nondisplaced fracture within the medial and lateral aspects of the patella, but not convincing, more likely prominent neutral vessel foramina. Electronically Signed   By: Bary RichardStan  Maynard M.D.   On: 05/14/2019 20:33   Dg Femur Min 2 Views Right  Result Date: 05/14/2019 CLINICAL DATA:  Acute pain and  swelling. Status post fall. EXAM: RIGHT FEMUR 2 VIEWS COMPARISON:  None. FINDINGS: Slightly displaced fracture of the lateral femoral condyle, without convincing extension to the underlying articular surface. Medial femoral condyle appears intact. Proximal tibia and fibula appear intact and normally aligned. Proximal femur appears intact and normally aligned. Prominent joint effusion within the suprapatellar bursa. Extensive vascular calcifications throughout the RIGHT lower extremity. IMPRESSION: 1. Displaced fracture of the lateral femoral condyle, without convincing extension to the underlying articular surface. 2. Prominent joint effusion within the suprapatellar bursa. 3. Extensive vascular calcifications. Electronically Signed   By: Bary RichardStan  Maynard M.D.   On: 05/14/2019 18:59    Discharge Exam: Vitals:   05/15/19 2124 05/16/19 0518  BP: (!) 155/82 (!) 163/94  Pulse: 79 86  Resp: 18 16  Temp: 98.6 F (37 C) 98.7 F (37.1 C)  SpO2: 94% 93%   Vitals:   05/15/19 1422 05/15/19 1939 05/15/19 2124 05/16/19 0518  BP: (!) 168/88  (!) 155/82 (!) 163/94  Pulse: 87  79 86  Resp: 16  18 16   Temp: 98 F (36.7 C)  98.6 F (37 C) 98.7 F (37.1 C)  TempSrc:   Oral Oral  SpO2: 96% 94% 94% 93%  Weight:      Height:        General: Pt is alert, awake, not in acute distress, difficult to assess due to dementia Cardiovascular: RRR, S1/S2 +, no rubs, no gallops Respiratory: CTA bilaterally, no wheezing, no rhonchi Abdominal: Soft, NT, ND, bowel sounds + Extremities: no edema, no cyanosis, right knee in brace    The results of significant diagnostics from this hospitalization (including imaging, microbiology, ancillary and laboratory) are listed below for reference.     Microbiology: Recent Results (from the past 240 hour(s))  Urine culture     Status: Abnormal   Collection Time: 05/14/19  5:26 PM   Specimen: Urine, Clean Catch  Result Value Ref Range Status   Specimen Description   Final     URINE, CLEAN CATCH Performed at [facility], [address] [place], [place] [address]    Special Requests   Final    NONE Performed at [facility], [address] [place], [place] [address]    Culture (A)  Final    <10,000 COLONIES/mL INSIGNIFICANT GROWTH Performed at [facility], [address] [place], [place] [address]    Report Status 05/15/2019 FINAL  Final  SARS Coronavirus 2 (CEPHEID - Performed in [facility] hospital lab), Hosp Order     Status: None   Collection Time: 05/14/19  8:25 PM   Specimen: Nasopharyngeal Swab  Result Value Ref Range Status   SARS Coronavirus 2 NEGATIVE NEGATIVE Final    Comment: (NOTE) If result is NEGATIVE SARS-CoV-2 target nucleic acids are NOT DETECTED. The SARS-CoV-2 RNA is generally detectable in upper and lower  respiratory specimens during the acute phase of infection. The lowest  concentration of SARS-CoV-2 viral copies this assay can detect is 250  copies / mL. A negative result does not preclude SARS-CoV-2 infection  and should not be used as the sole basis for treatment or other  patient management decisions.  A negative result may occur with  improper specimen collection / handling, submission of specimen other  than nasopharyngeal swab, presence of viral mutation(s) within the  areas targeted by this assay, and inadequate number of viral copies  (<250 copies / mL). A negative result must be combined with clinical  observations, patient history, and epidemiological information. If result is POSITIVE SARS-CoV-2 target nucleic acids are DETECTED. The SARS-CoV-2 RNA is generally detectable in upper and lower  respiratory specimens dur ing the acute phase of infection.  Positive  results are indicative of active infection with SARS-CoV-2.  Clinical  correlation with patient history and other diagnostic information is  necessary to determine patient infection status.  Positive results do  not rule out bacterial infection  or co-infection with other viruses. If result is PRESUMPTIVE POSTIVE SARS-CoV-2 nucleic acids MAY BE PRESENT.   A presumptive positive result was obtained on the submitted specimen  and confirmed on repeat testing.  While 2019 novel coronavirus  (SARS-CoV-2) nucleic acids may be present in the submitted sample  additional confirmatory testing may be necessary for epidemiological  and / or clinical management purposes  to differentiate between  SARS-CoV-2 and other Sarbecovirus currently known to infect humans.  If clinically indicated additional testing with an alternate test  methodology [phone]) is advised. The SARS-CoV-2 RNA is generally  detectable in upper and lower respiratory sp ecimens during the acute  phase of infection. The expected result is Negative. Fact Sheet for Patients:  BoilerBrush.com.cyhttps://www.fda.gov/media/136312/download Fact Sheet for Healthcare Providers: https://pope.com/https://www.fda.gov/media/136313/download This test is not yet approved or cleared by the [place] FDA and has been authorized for detection and/or diagnosis of SARS-CoV-2 by FDA under an Emergency Use Authorization (EUA).  This EUA will remain in effect (meaning this test can be used) for the duration of the COVID-19 declaration under Section 564(b)(1) of the Act, 21 U.S.C. section 360bbb-3(b)(1), unless the authorization is terminated or revoked sooner. Performed at [facility], [address] [place], [place] [address]      Labs: BNP (last 3 results) No results for input(s): BNP in the last 8760 hours. Basic Metabolic Panel: Recent Labs  Lab 05/14/19 1706 05/15/19 0617 05/16/19 0511  NA 142 140 139  K 4.7 3.6 3.8  CL 101 98 98  CO2 32 32 29  GLUCOSE 130* 125* 105*  BUN 35* 29* 24*  CREATININE 1.57* 1.42* 1.25*  CALCIUM 8.9 8.9 8.7*   Liver Function Tests: Recent Labs  Lab 05/14/19 1706  AST 16  ALT 11  ALKPHOS 60  BILITOT 0.3  PROT 6.5  ALBUMIN 3.5   No results for input(s): LIPASE,  AMYLASE in the last 168 hours. No results for input(s): AMMONIA in the last 168 hours. CBC: Recent Labs  Lab 05/14/19 1706 05/16/19 0511  WBC 5.8 8.4  NEUTROABS 4.2  --   HGB 9.6* 8.9*  HCT 30.3* 28.6*  MCV 101.3* 100.4*  PLT 238 225   Cardiac Enzymes: No results for input(s): CKTOTAL, CKMB, CKMBINDEX, TROPONINI in the last 168 hours. BNP: Invalid input(s): POCBNP CBG: No results for input(s): GLUCAP in the last 168 hours. D-Dimer No results for input(s): DDIMER in the last 72 hours. Hgb A1c No results for input(s): HGBA1C in the last 72 hours. Lipid Profile No results for input(s): CHOL, HDL, LDLCALC, TRIG, CHOLHDL, LDLDIRECT  in the last 72 hours. Thyroid function studies No results for input(s): TSH, T4TOTAL, T3FREE, THYROIDAB in the last 72 hours.  Invalid input(s): FREET3 Anemia work up No results for input(s): VITAMINB12, FOLATE, FERRITIN, TIBC, IRON, RETICCTPCT in the last 72 hours. Urinalysis    Component Value Date/Time   COLORURINE YELLOW 05/14/2019 1726   APPEARANCEUR CLEAR 05/14/2019 1726   LABSPEC 1.014 05/14/2019 1726   PHURINE 7.0 05/14/2019 1726   GLUCOSEU NEGATIVE 05/14/2019 1726   HGBUR NEGATIVE 05/14/2019 1726   BILIRUBINUR NEGATIVE 05/14/2019 1726   KETONESUR NEGATIVE 05/14/2019 1726   PROTEINUR NEGATIVE 05/14/2019 1726   NITRITE NEGATIVE 05/14/2019 1726   LEUKOCYTESUR TRACE (A) 05/14/2019 1726   Sepsis Labs Invalid input(s): PROCALCITONIN,  WBC,  LACTICIDVEN Microbiology Recent Results (from the past 240 hour(s))  Urine culture     Status: Abnormal   Collection Time: 05/14/19  5:26 PM   Specimen: Urine, Clean Catch  Result Value Ref Range Status   Specimen Description   Final    URINE, CLEAN CATCH Performed at [facility], [address] [place], [place] [address]    Special Requests   Final    NONE Performed at [facility], [address] [place], [place] [address]    Culture (A)  Final    <10,000 COLONIES/mL INSIGNIFICANT  GROWTH Performed at [facility], [address] [place], [place] [address]    Report Status 05/15/2019 FINAL  Final  SARS Coronavirus 2 (CEPHEID - Performed in [facility] hospital lab), Hosp Order     Status: None   Collection Time: 05/14/19  8:25 PM   Specimen: Nasopharyngeal Swab  Result Value Ref Range Status   SARS Coronavirus 2 NEGATIVE NEGATIVE Final    Comment: (NOTE) If result is NEGATIVE SARS-CoV-2 target nucleic acids are NOT DETECTED. The SARS-CoV-2 RNA is generally detectable in upper and lower  respiratory specimens during the acute phase of infection. The lowest  concentration of SARS-CoV-2 viral copies this assay can detect is 250  copies / mL. A negative result does not preclude SARS-CoV-2 infection  and should not be used as the sole basis for treatment or other  patient management decisions.  A negative result may occur with  improper specimen collection / handling, submission of specimen other  than nasopharyngeal swab, presence of viral mutation(s) within the  areas targeted by this assay, and inadequate number of viral copies  (<250 copies / mL). A negative result must be combined with clinical  observations, patient history, and epidemiological information. If result is POSITIVE SARS-CoV-2 target nucleic acids are DETECTED. The SARS-CoV-2 RNA is generally detectable in upper and lower  respiratory specimens dur ing the acute phase of infection.  Positive  results are indicative of active infection with SARS-CoV-2.  Clinical  correlation with patient history and other diagnostic information is  necessary to determine patient infection status.  Positive results do  not rule out bacterial infection or co-infection with other viruses. If result is PRESUMPTIVE POSTIVE SARS-CoV-2 nucleic acids MAY BE PRESENT.   A presumptive positive result was obtained on the submitted specimen  and confirmed on repeat testing.  While 2019 novel coronavirus  (SARS-CoV-2)  nucleic acids may be present in the submitted sample  additional confirmatory testing may be necessary for epidemiological  and / or clinical management purposes  to differentiate between  SARS-CoV-2 and other Sarbecovirus currently known to infect humans.  If clinically indicated additional testing with an alternate test  methodology [phone]([id number]) is advised.  The SARS-CoV-2 RNA is generally  detectable in upper and lower respiratory sp ecimens during the acute  phase of infection. The expected result is Negative. Fact Sheet for Patients:  BoilerBrush.com.cy Fact Sheet for Healthcare Providers: https://pope.com/ This test is not yet approved or cleared by the [place] FDA and has been authorized for detection and/or diagnosis of SARS-CoV-2 by FDA under an Emergency Use Authorization (EUA).  This EUA will remain in effect (meaning this test can be used) for the duration of the COVID-19 declaration under Section 564(b)(1) of the Act, 21 U.S.C. section 360bbb-3(b)(1), unless the authorization is terminated or revoked sooner. Performed at [facility], [address] [place], [place] [address]      Time coordinating discharge: 35 minutes  SIGNED:   Erick Blinks, DO [facility] 05/16/2019, 1:22 PM  If 7PM-7AM, please contact night-coverage [email or web address] Password [email or web address]

## 2019-06-15 ENCOUNTER — Ambulatory Visit (INDEPENDENT_AMBULATORY_CARE_PROVIDER_SITE_OTHER): Admitting: Orthopedic Surgery

## 2019-06-15 ENCOUNTER — Other Ambulatory Visit: Payer: Self-pay

## 2019-06-15 ENCOUNTER — Ambulatory Visit: Payer: Medicare HMO

## 2019-06-15 ENCOUNTER — Other Ambulatory Visit: Payer: Self-pay | Admitting: Orthopedic Surgery

## 2019-06-15 VITALS — Temp 97.2°F

## 2019-06-15 DIAGNOSIS — G8929 Other chronic pain: Secondary | ICD-10-CM

## 2019-06-15 DIAGNOSIS — M25561 Pain in right knee: Secondary | ICD-10-CM | POA: Diagnosis not present

## 2019-06-15 NOTE — Progress Notes (Signed)
Progress Note   Patient ID: Melissa Hill, female   DOB: 1930-01-02, 85 y.o.   MRN: [id number]   Chief Complaint  Patient presents with  . Leg Pain    R/ER Fol/Up/Fx    Encounter Diagnosis  Name Primary?  . Chronic pain of right knee Yes    HPI 85 year old female being treated nonoperatively for right femoral condyle fracture   Consultation May 16, 2019 MEDICAL DECISION MAKING (minimum/low)   Data Reviewed   I have personally reviewed the imaging studies and the report and my interpretation is:  Plain films show a minimally displaced lateral condyle fracture extends into the joint.  This is confirmed on CT scan.  There is question of whether or not the patella is correct as well.   Assessment and Plan  Nonoperative treatment weightbearing no weightbearing and knee immobilizer for 2 weeks   X-ray in 2 weeks apply hinged brace at 2-week visit   Delay weightbearing for 6 weeks   ROS    Temp (!) 97.2 F (36.2 C)   Physical Exam   Medical decisions:  (Established problem worse, x-ray ,physical therapy, over-the-counter medicines, read outside film or summarize x-ray)  Data  Imaging:   X-ray AP lateral right knee lateral condyle fracture nondisplaced  Encounter Diagnosis  Name Primary?  . Chronic pain of right knee Yes    PLAN:   I had an emergent surgery had to go to the OR.  X-ray was obtained.  X-ray shows no further displacement of the fracture  Recommend hinged knee brace  Patient placed in economy hinged knee brace  Continue weightbearing with walker and brace follow-up for x-rays 4 to 6 weeks    Arther Abbott, MD 06/15/2019 10:58 AM

## 2019-07-10 ENCOUNTER — Encounter (INDEPENDENT_AMBULATORY_CARE_PROVIDER_SITE_OTHER): Payer: Self-pay | Admitting: Orthopaedic Surgery

## 2019-07-13 ENCOUNTER — Ambulatory Visit: Payer: Medicare HMO

## 2019-07-13 ENCOUNTER — Other Ambulatory Visit: Payer: Self-pay

## 2019-07-13 ENCOUNTER — Ambulatory Visit (INDEPENDENT_AMBULATORY_CARE_PROVIDER_SITE_OTHER): Admitting: Orthopedic Surgery

## 2019-07-13 VITALS — Temp 96.9°F

## 2019-07-13 DIAGNOSIS — S72421D Displaced fracture of lateral condyle of right femur, subsequent encounter for closed fracture with routine healing: Secondary | ICD-10-CM | POA: Diagnosis not present

## 2019-07-13 NOTE — Progress Notes (Signed)
Chief Complaint  Patient presents with  . Follow-up    Recheck on right knee fracture, DOI 05/14/19.    Patient has a lateral condyle fracture she is doing reasonably well she is wearing a brace  Her knee flexion is 120 degrees she has a slight flexion contracture 5 degrees she has no pain with range of motion of the right knee her x-ray shows fracture healing with some valgus alignment  However, she is released she should wear the brace though for support  Follow-up as needed

## 2019-10-19 DEATH — deceased

## 2021-04-15 IMAGING — CT CT OF THE RIGHT KNEE WITHOUT CONTRAST
3 of 4 series · 14 of 33 positions shown, 17 images · non-contrast
Comparison: None.

CLINICAL DATA: Fall today. Per x-ray report, displaced fracture of
lateral femoral condyle.

EXAM:
CT OF THE RIGHT KNEE WITHOUT CONTRAST
TECHNIQUE: Multidetector CT imaging of the RIGHT knee was performed according
to the standard protocol. Multiplanar CT image reconstructions were
also generated.

[Series 5: cor bone · coronal · 0.28mm/px · 1 of 144 slices shown]
[im 72/144  bone]
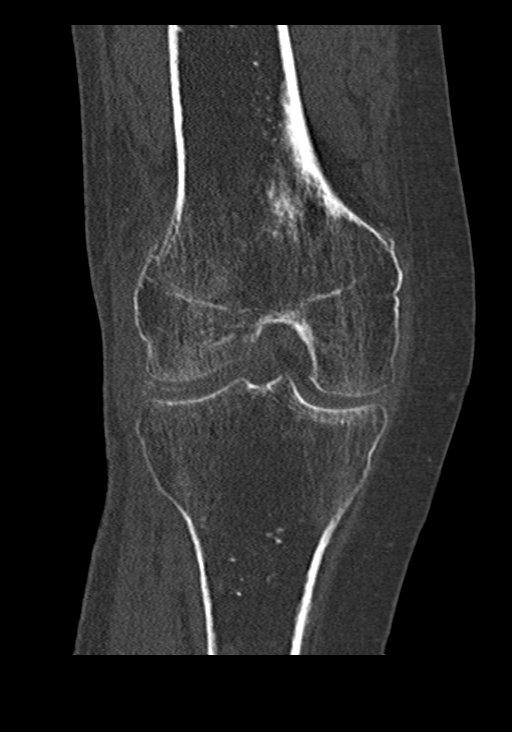

[Series 7: axial st · axial · 0.27mm/px · z∈[+701,+861]mm · 8 of 189 slices shown, 10 images]
[im 15/189  soft-tissue]
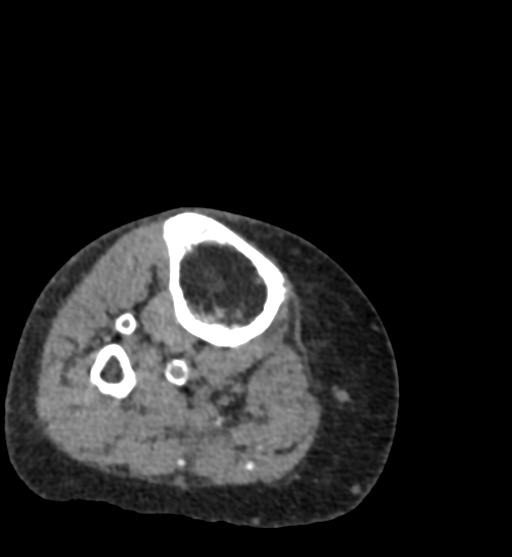
[im 15/189  bone]
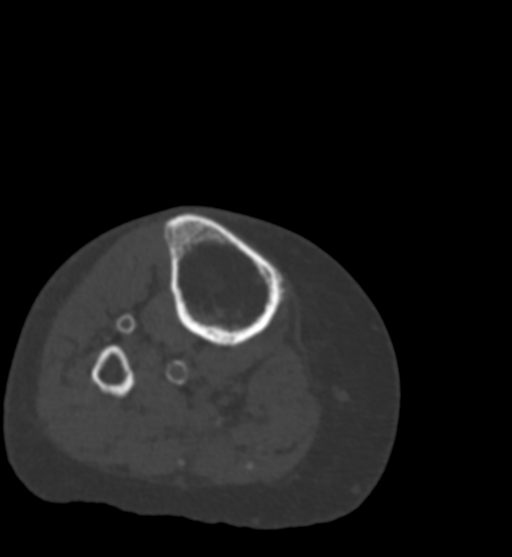
[im 44/189  bone]
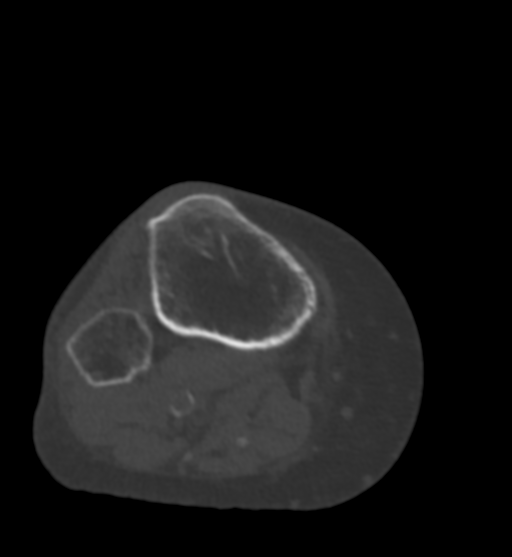
[im 58/189  bone]
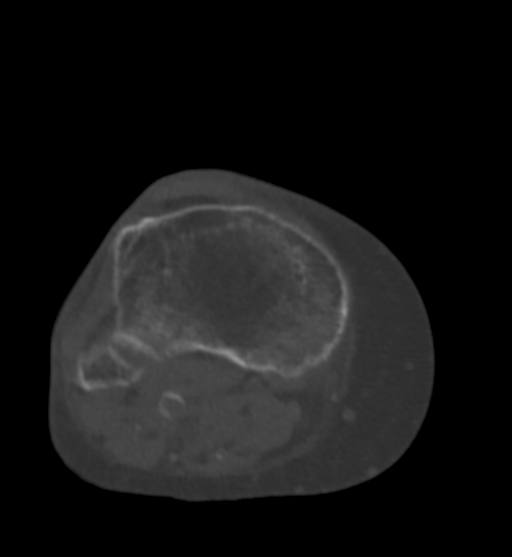
[im 87/189  bone]
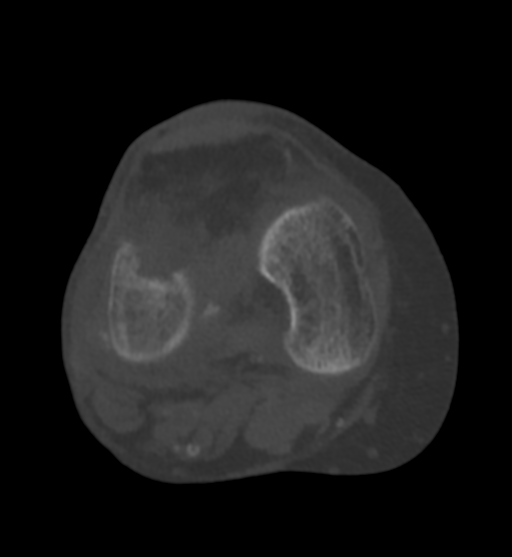
[im 102/189  soft-tissue]
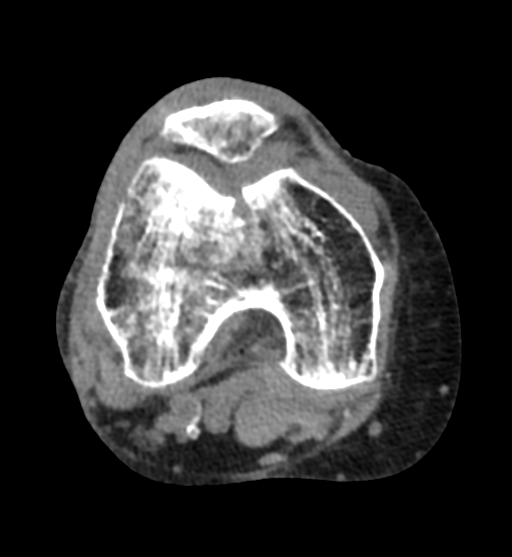
[im 102/189  bone]
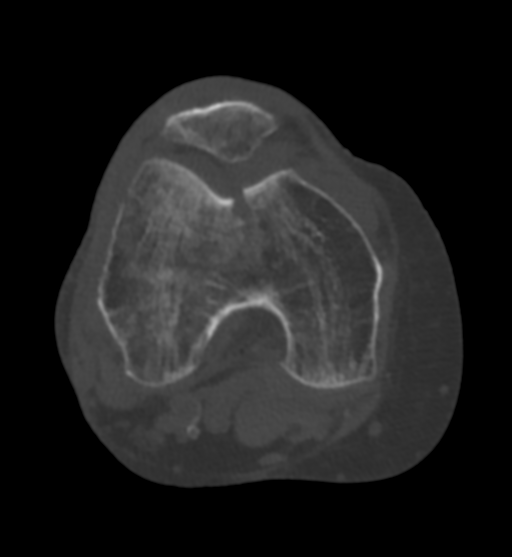
[im 131/189  bone]
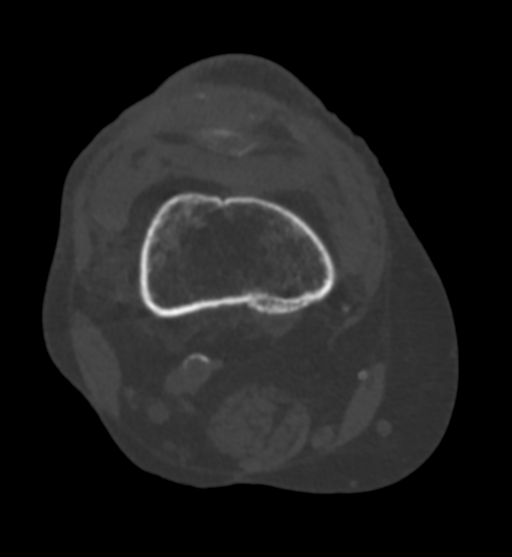
[im 145/189  bone]
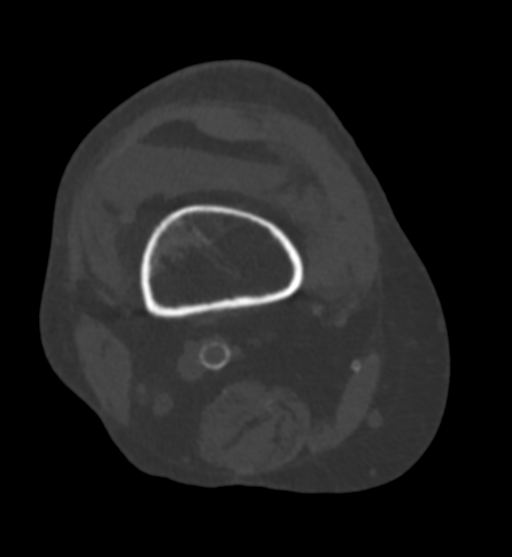
[im 174/189  bone]
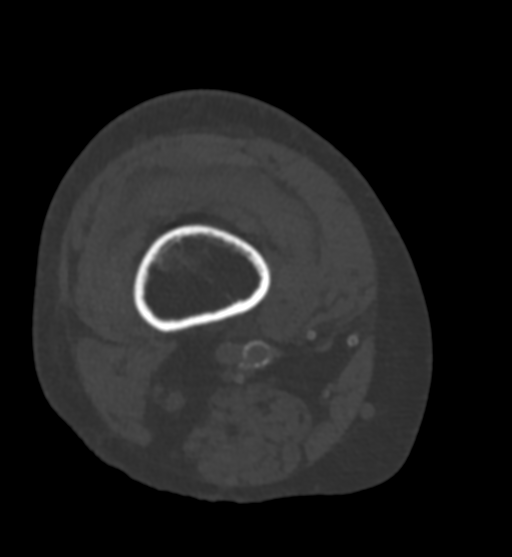

[Series 9: sag st · sagittal · 0.31mm/px · 5 of 127 slices shown, 6 images]
[im 43/127  bone]
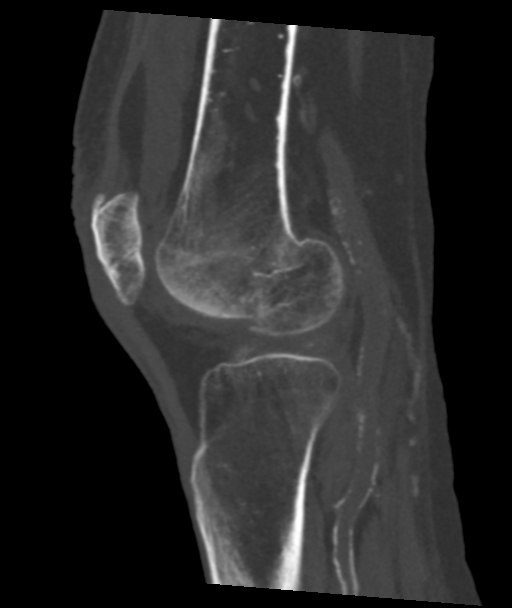
[im 53/127  bone]
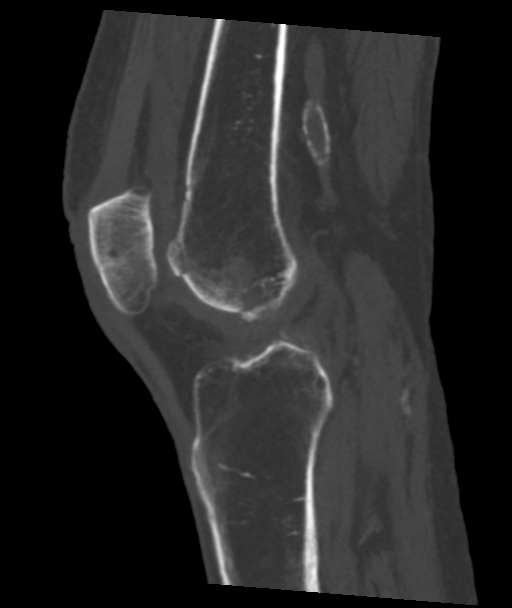
[im 64/127  soft-tissue]
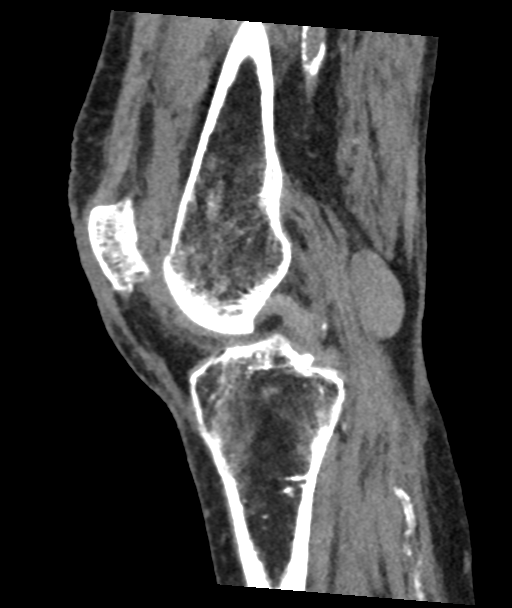
[im 64/127  bone]
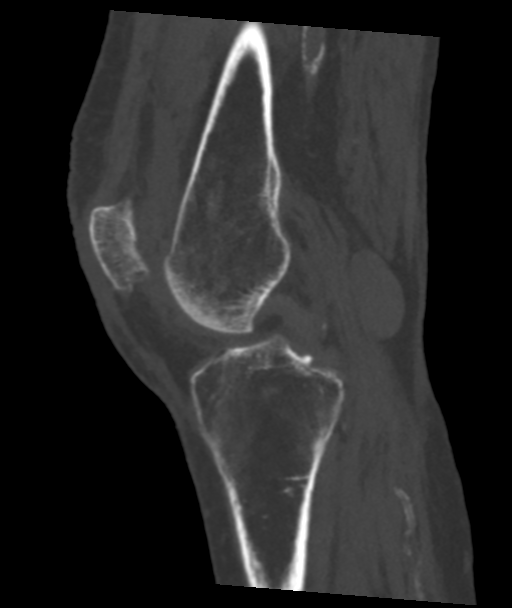
[im 74/127  bone]
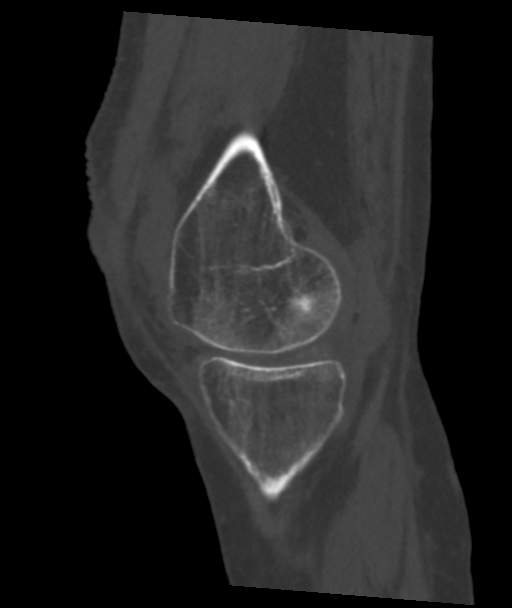
[im 85/127  bone]
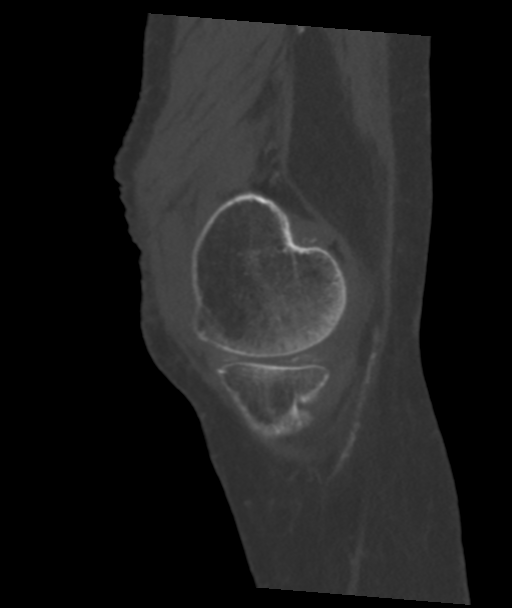

[14 of 33 positions shown; findings below may reference images not displayed]

FINDINGS: Bones/Joint/Cartilage

A displaced fracture is confirmed within the lateral femoral
condyle, comminuted with extension to the posterior and inferior
cortical margins (best seen on sagittal series 6, image 36) and
extending centrally and anteriorly to the midline anterior cortex
where there is approximately 6 mm fracture diastasis (coronal series
5, image 49).

No fracture extension to the medial femoral condyle. Tibial plateau
is intact. Proximal fibula is intact.

Associated joint effusion with lipohemarthrosis level in the
suprapatellar bursa.

Questionable nondisplaced fractures within the medial and lateral
portion of the patella, but not convincing.
IMPRESSION: 1. Displaced/comminuted fracture within the lateral femoral condyle,
with extension to the posterior and inferior cortex, and extending
centrally and inferiorly to the midline femur where there is
approximately 6 mm fracture diastasis at the articular surface.
2. Associated joint effusion with lipohemarthrosis level in the
suprapatellar bursa.
3. Questionable nondisplaced fracture within the medial and lateral
aspects of the patella, but not convincing, more likely prominent
neutral vessel foramina.

## 2021-04-15 IMAGING — CT CT HEAD WITHOUT CONTRAST
5 of 8 series · 17 of 47 positions shown, 18 images · non-contrast
Comparison: None.

CLINICAL DATA: Fall.  Head injury

EXAM:
CT HEAD WITHOUT CONTRAST
CT CERVICAL SPINE WITHOUT CONTRAST
TECHNIQUE: Multidetector CT imaging of the head and cervical spine was
performed following the standard protocol without intravenous
contrast. Multiplanar CT image reconstructions of the cervical spine
were also generated.

[Series 2: head w o · axial · 0.43mm/px · z∈[-153,-73]mm · 3 of 32 slices shown, 4 images]
[im 8/32  brain]
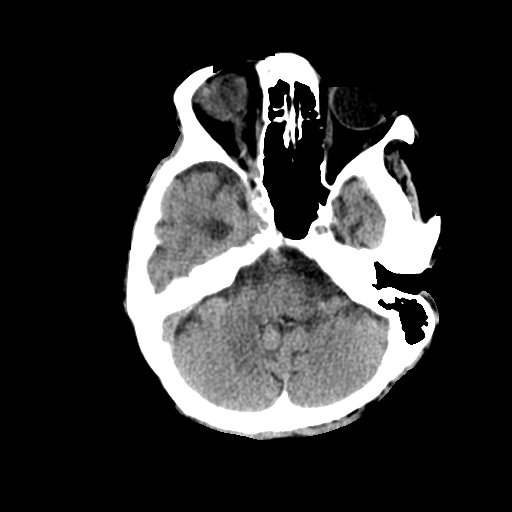
[im 8/32  bone]
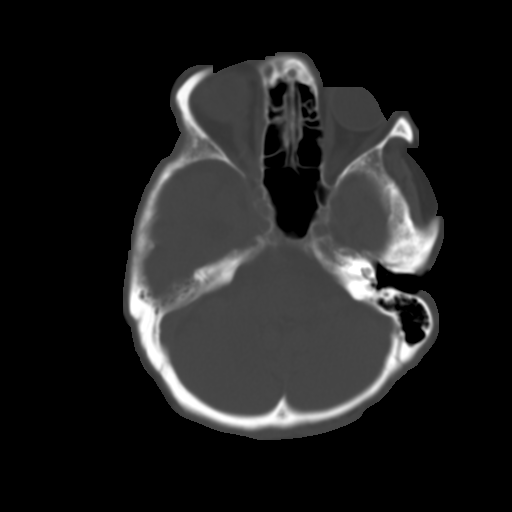
[im 16/32  brain]
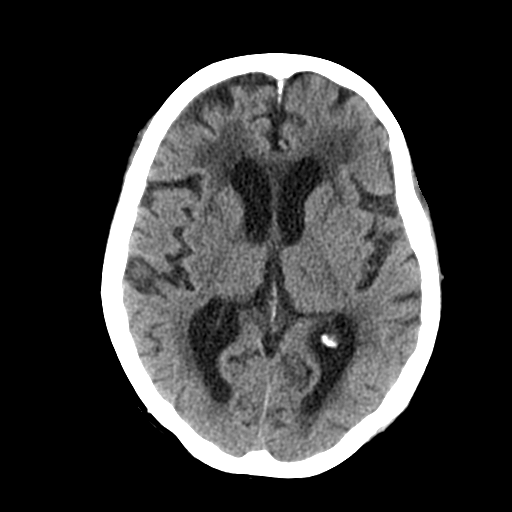
[im 24/32  brain]
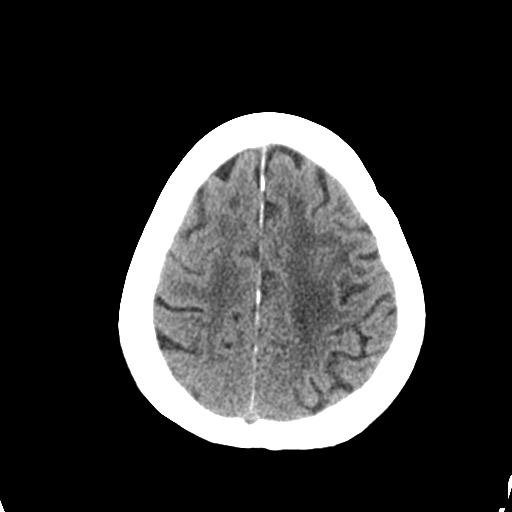

[Series 4: coronal soft · coronal · 0.33mm/px · 3 of 67 slices shown]
[im 25/67  brain]
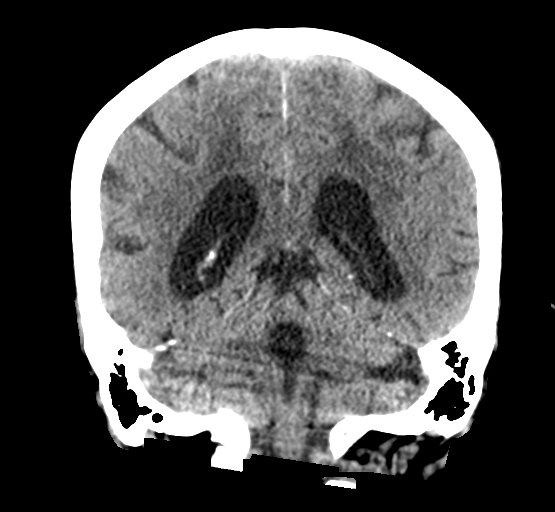
[im 34/67  brain]
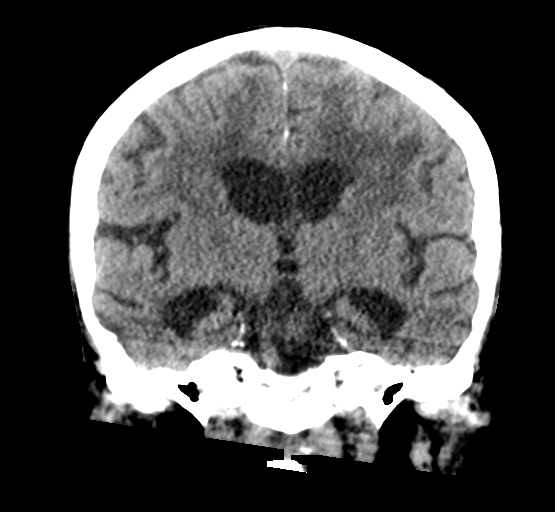
[im 42/67  brain]
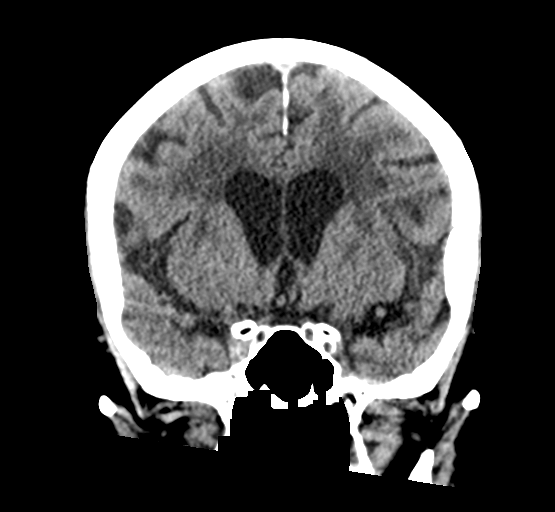

[Series 5: sagittal soft · sagittal · 0.34mm/px · 1 of 56 slices shown]
[im 28/56  brain]
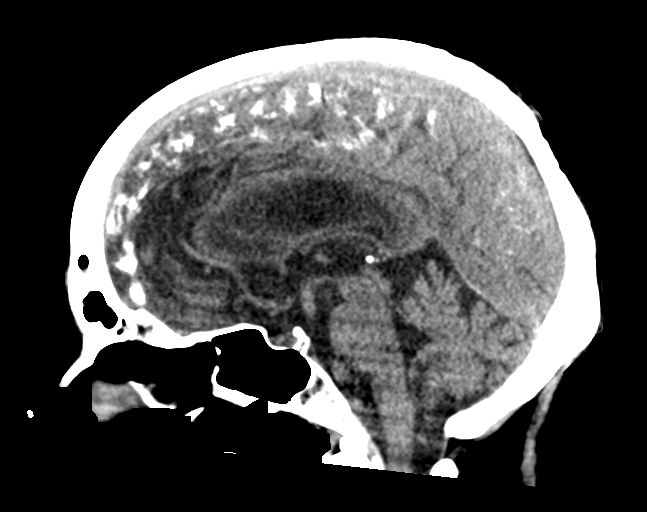

[Series 7: c spine soft · axial · 0.34mm/px · z∈[-328,-312]mm · 2 of 89 slices shown]
[im 9/89  brain]
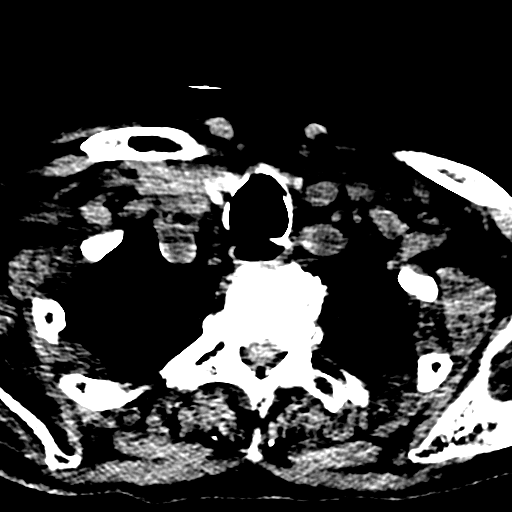
[im 17/89  brain]
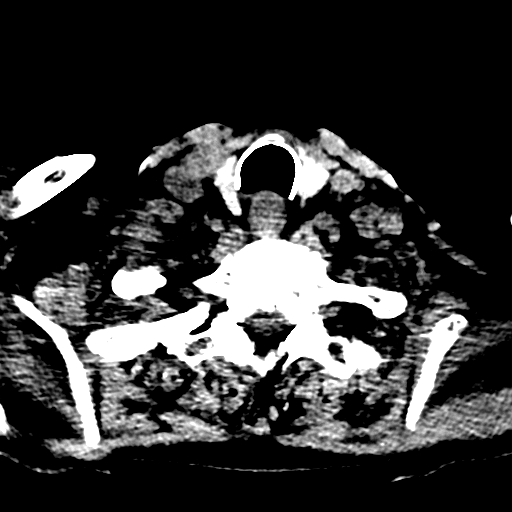

[Series 12: orthogonal axials · axial · 0.21mm/px · z∈[-349,-251]mm · 8 of 88 slices shown]
[im 8/88  brain]
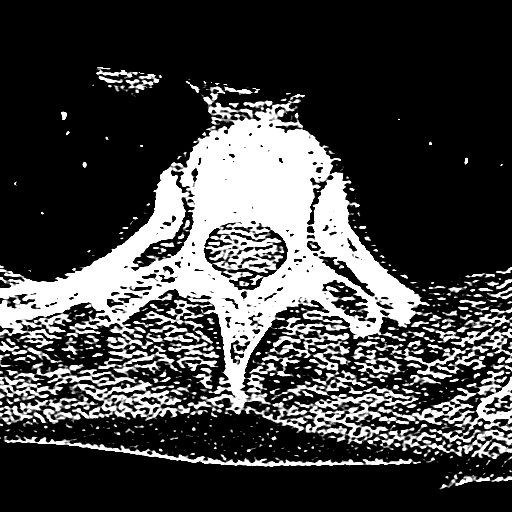
[im 16/88  brain]
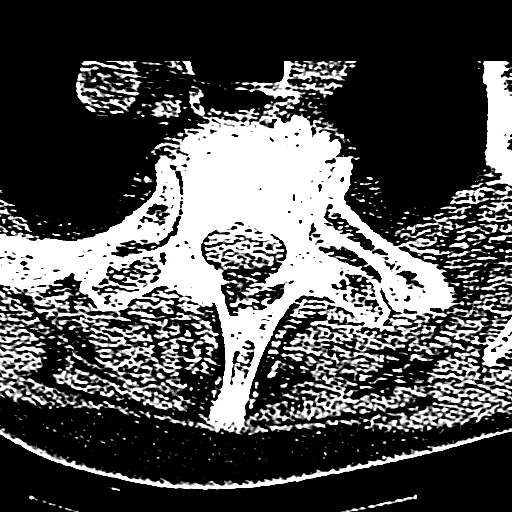
[im 32/88  brain]
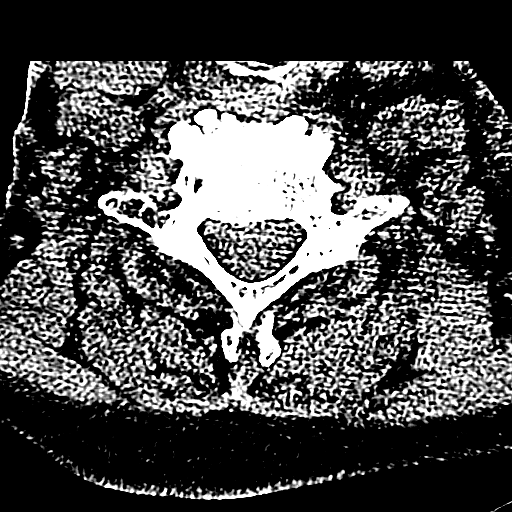
[im 40/88  brain]
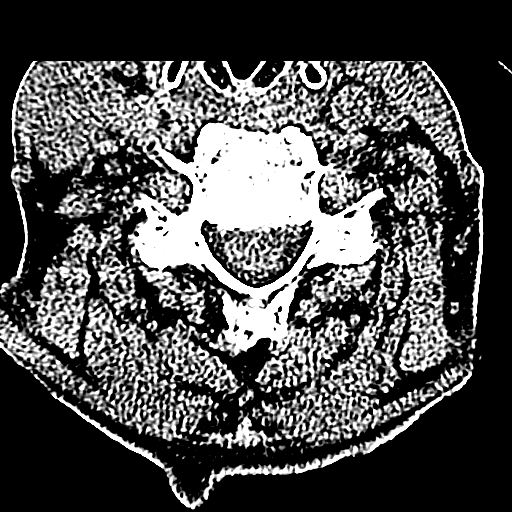
[im 48/88  brain]
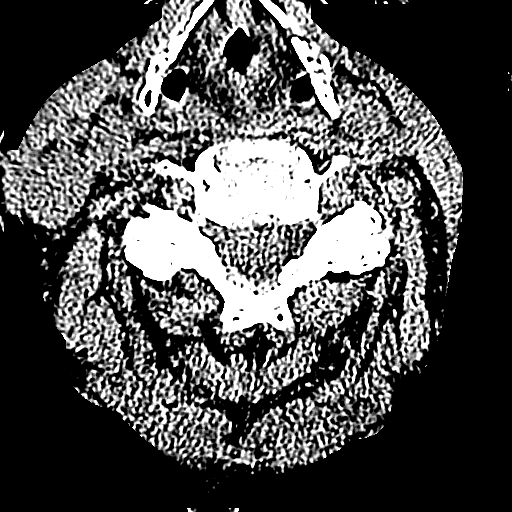
[im 56/88  brain]
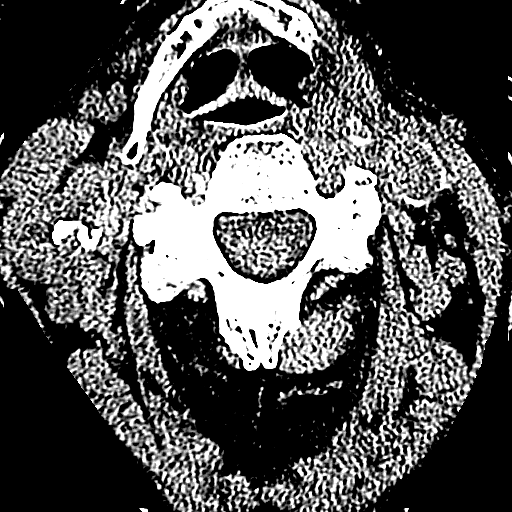
[im 72/88  brain]
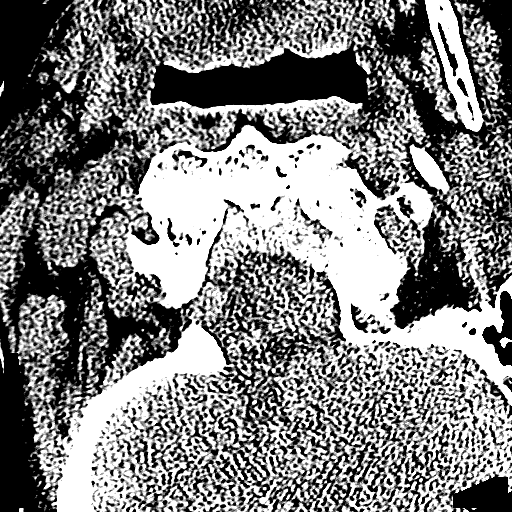
[im 80/88  brain]
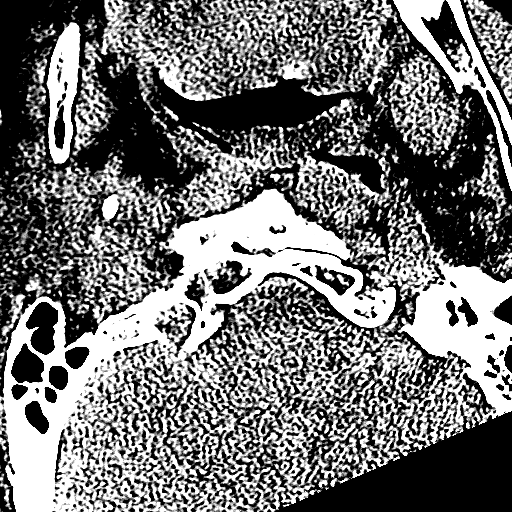

[17 of 47 positions shown; findings below may reference images not displayed]

FINDINGS: CT HEAD FINDINGS

Brain: Generalized atrophy. Extensive white matter disease
bilaterally which is symmetric and appears chronic. Negative for
acute infarct, hemorrhage, mass. No fluid collection or midline
shift.

Vascular: Negative for hyperdense vessel. Atherosclerotic
calcification in the cavernous carotid bilaterally.

Skull: Negative for fracture

Sinuses/Orbits: Mild sinus mucosal disease. Bilateral cataract
surgery

Other: None

CT CERVICAL SPINE FINDINGS

Alignment: Mild anterolisthesis C3-4 and C4-5 and C7-T1

Skull base and vertebrae: Negative for fracture

Soft tissues and spinal canal: Carotid artery atherosclerotic
disease. Negative for mass or adenopathy.

Disc levels: Multilevel disc and facet degeneration throughout the
cervical spine and upper thoracic spine. Extensive spurring.
Foraminal encroachment bilaterally at C3-4, C4-5, C5-6, C6-7.

Upper chest: Lung apices clear bilaterally

Other: None
IMPRESSION: 1. No acute intracranial abnormality. Atrophy and extensive white
matter disease most likely chronic microvascular ischemia
2. Advanced cervical spondylosis.  Negative for fracture
